# Patient Record
Sex: Female | Born: 1955 | Race: White | Hispanic: No | Marital: Single | Smoking: Former smoker
Health system: Southern US, Community
[De-identification: ages and names within clinical notes are randomized; demographics above are authoritative.]

## PROBLEM LIST (undated history)

## (undated) DIAGNOSIS — K754 Autoimmune hepatitis: Secondary | ICD-10-CM

## (undated) DIAGNOSIS — K529 Noninfective gastroenteritis and colitis, unspecified: Secondary | ICD-10-CM

## (undated) DIAGNOSIS — D126 Benign neoplasm of colon, unspecified: Secondary | ICD-10-CM

## (undated) DIAGNOSIS — K5730 Diverticulosis of large intestine without perforation or abscess without bleeding: Secondary | ICD-10-CM

## (undated) DIAGNOSIS — J439 Emphysema, unspecified: Secondary | ICD-10-CM

## (undated) DIAGNOSIS — K5289 Other specified noninfective gastroenteritis and colitis: Secondary | ICD-10-CM

## (undated) DIAGNOSIS — K648 Other hemorrhoids: Secondary | ICD-10-CM

## (undated) HISTORY — DX: Diverticulosis of large intestine without perforation or abscess without bleeding: K57.30

## (undated) HISTORY — DX: Autoimmune hepatitis: K75.4

## (undated) HISTORY — DX: Other specified noninfective gastroenteritis and colitis: K52.89

## (undated) HISTORY — PX: LIVER BIOPSY: SHX301

## (undated) HISTORY — DX: Noninfective gastroenteritis and colitis, unspecified: K52.9

## (undated) HISTORY — PX: ABDOMINAL HYSTERECTOMY: SUR658

## (undated) HISTORY — DX: Benign neoplasm of colon, unspecified: D12.6

## (undated) HISTORY — DX: Other hemorrhoids: K64.8

## (undated) HISTORY — PX: COLONOSCOPY: SHX174

## (undated) HISTORY — PX: POLYPECTOMY: SHX149

## (undated) HISTORY — DX: Emphysema, unspecified: J43.9

## (undated) HISTORY — PX: KNEE ARTHROSCOPY: SUR90

---

## 2001-04-05 ENCOUNTER — Emergency Department (HOSPITAL_COMMUNITY): Admission: EM | Admit: 2001-04-05 | Discharge: 2001-04-05 | Payer: Self-pay | Admitting: Emergency Medicine

## 2001-08-25 ENCOUNTER — Other Ambulatory Visit: Admission: RE | Admit: 2001-08-25 | Discharge: 2001-08-25 | Payer: Self-pay | Admitting: Obstetrics and Gynecology

## 2002-07-21 ENCOUNTER — Ambulatory Visit (HOSPITAL_COMMUNITY): Admission: RE | Admit: 2002-07-21 | Discharge: 2002-07-21 | Payer: Self-pay | Admitting: Gastroenterology

## 2002-07-21 ENCOUNTER — Encounter (INDEPENDENT_AMBULATORY_CARE_PROVIDER_SITE_OTHER): Payer: Self-pay | Admitting: Specialist

## 2002-07-21 ENCOUNTER — Encounter: Payer: Self-pay | Admitting: Gastroenterology

## 2002-09-18 ENCOUNTER — Encounter: Payer: Self-pay | Admitting: Emergency Medicine

## 2002-09-18 ENCOUNTER — Emergency Department (HOSPITAL_COMMUNITY): Admission: EM | Admit: 2002-09-18 | Discharge: 2002-09-18 | Payer: Self-pay | Admitting: Emergency Medicine

## 2002-09-21 ENCOUNTER — Encounter: Payer: Self-pay | Admitting: Gastroenterology

## 2002-09-21 ENCOUNTER — Ambulatory Visit (HOSPITAL_COMMUNITY): Admission: RE | Admit: 2002-09-21 | Discharge: 2002-09-21 | Payer: Self-pay | Admitting: Gastroenterology

## 2002-11-15 ENCOUNTER — Other Ambulatory Visit: Admission: RE | Admit: 2002-11-15 | Discharge: 2002-11-15 | Payer: Self-pay | Admitting: Obstetrics and Gynecology

## 2002-12-24 ENCOUNTER — Encounter: Payer: Self-pay | Admitting: Internal Medicine

## 2002-12-24 ENCOUNTER — Encounter: Admission: RE | Admit: 2002-12-24 | Discharge: 2002-12-24 | Payer: Self-pay | Admitting: Internal Medicine

## 2003-04-27 ENCOUNTER — Ambulatory Visit (HOSPITAL_COMMUNITY): Admission: RE | Admit: 2003-04-27 | Discharge: 2003-04-27 | Payer: Self-pay | Admitting: Gastroenterology

## 2003-05-08 ENCOUNTER — Ambulatory Visit (HOSPITAL_COMMUNITY): Admission: RE | Admit: 2003-05-08 | Discharge: 2003-05-08 | Payer: Self-pay | Admitting: Gastroenterology

## 2004-06-24 ENCOUNTER — Ambulatory Visit: Payer: Self-pay | Admitting: Internal Medicine

## 2004-07-08 ENCOUNTER — Ambulatory Visit: Payer: Self-pay | Admitting: Gastroenterology

## 2004-07-15 ENCOUNTER — Ambulatory Visit: Payer: Self-pay | Admitting: Gastroenterology

## 2004-11-26 ENCOUNTER — Other Ambulatory Visit: Admission: RE | Admit: 2004-11-26 | Discharge: 2004-11-26 | Payer: Self-pay | Admitting: Obstetrics and Gynecology

## 2004-12-25 ENCOUNTER — Ambulatory Visit: Payer: Self-pay | Admitting: Gastroenterology

## 2004-12-30 ENCOUNTER — Ambulatory Visit: Payer: Self-pay | Admitting: Gastroenterology

## 2005-01-22 ENCOUNTER — Emergency Department (HOSPITAL_COMMUNITY): Admission: EM | Admit: 2005-01-22 | Discharge: 2005-01-22 | Payer: Self-pay | Admitting: Emergency Medicine

## 2005-06-23 ENCOUNTER — Ambulatory Visit: Payer: Self-pay | Admitting: Gastroenterology

## 2005-06-30 ENCOUNTER — Ambulatory Visit: Payer: Self-pay | Admitting: Gastroenterology

## 2005-08-01 ENCOUNTER — Encounter (INDEPENDENT_AMBULATORY_CARE_PROVIDER_SITE_OTHER): Payer: Self-pay | Admitting: Specialist

## 2005-08-01 ENCOUNTER — Ambulatory Visit: Payer: Self-pay | Admitting: Gastroenterology

## 2005-09-01 ENCOUNTER — Ambulatory Visit: Payer: Self-pay | Admitting: Gastroenterology

## 2006-04-20 IMAGING — CR DG CHEST 2V
2 series · 2 of 2 positions shown · non-contrast
Comparison: 04/27/03.

CLINICAL DATA: Shortness of breath and cough.
 CHEST - 2 VIEW: 
 PA and lateral chest ? 01/22/05:

[w chest pa]
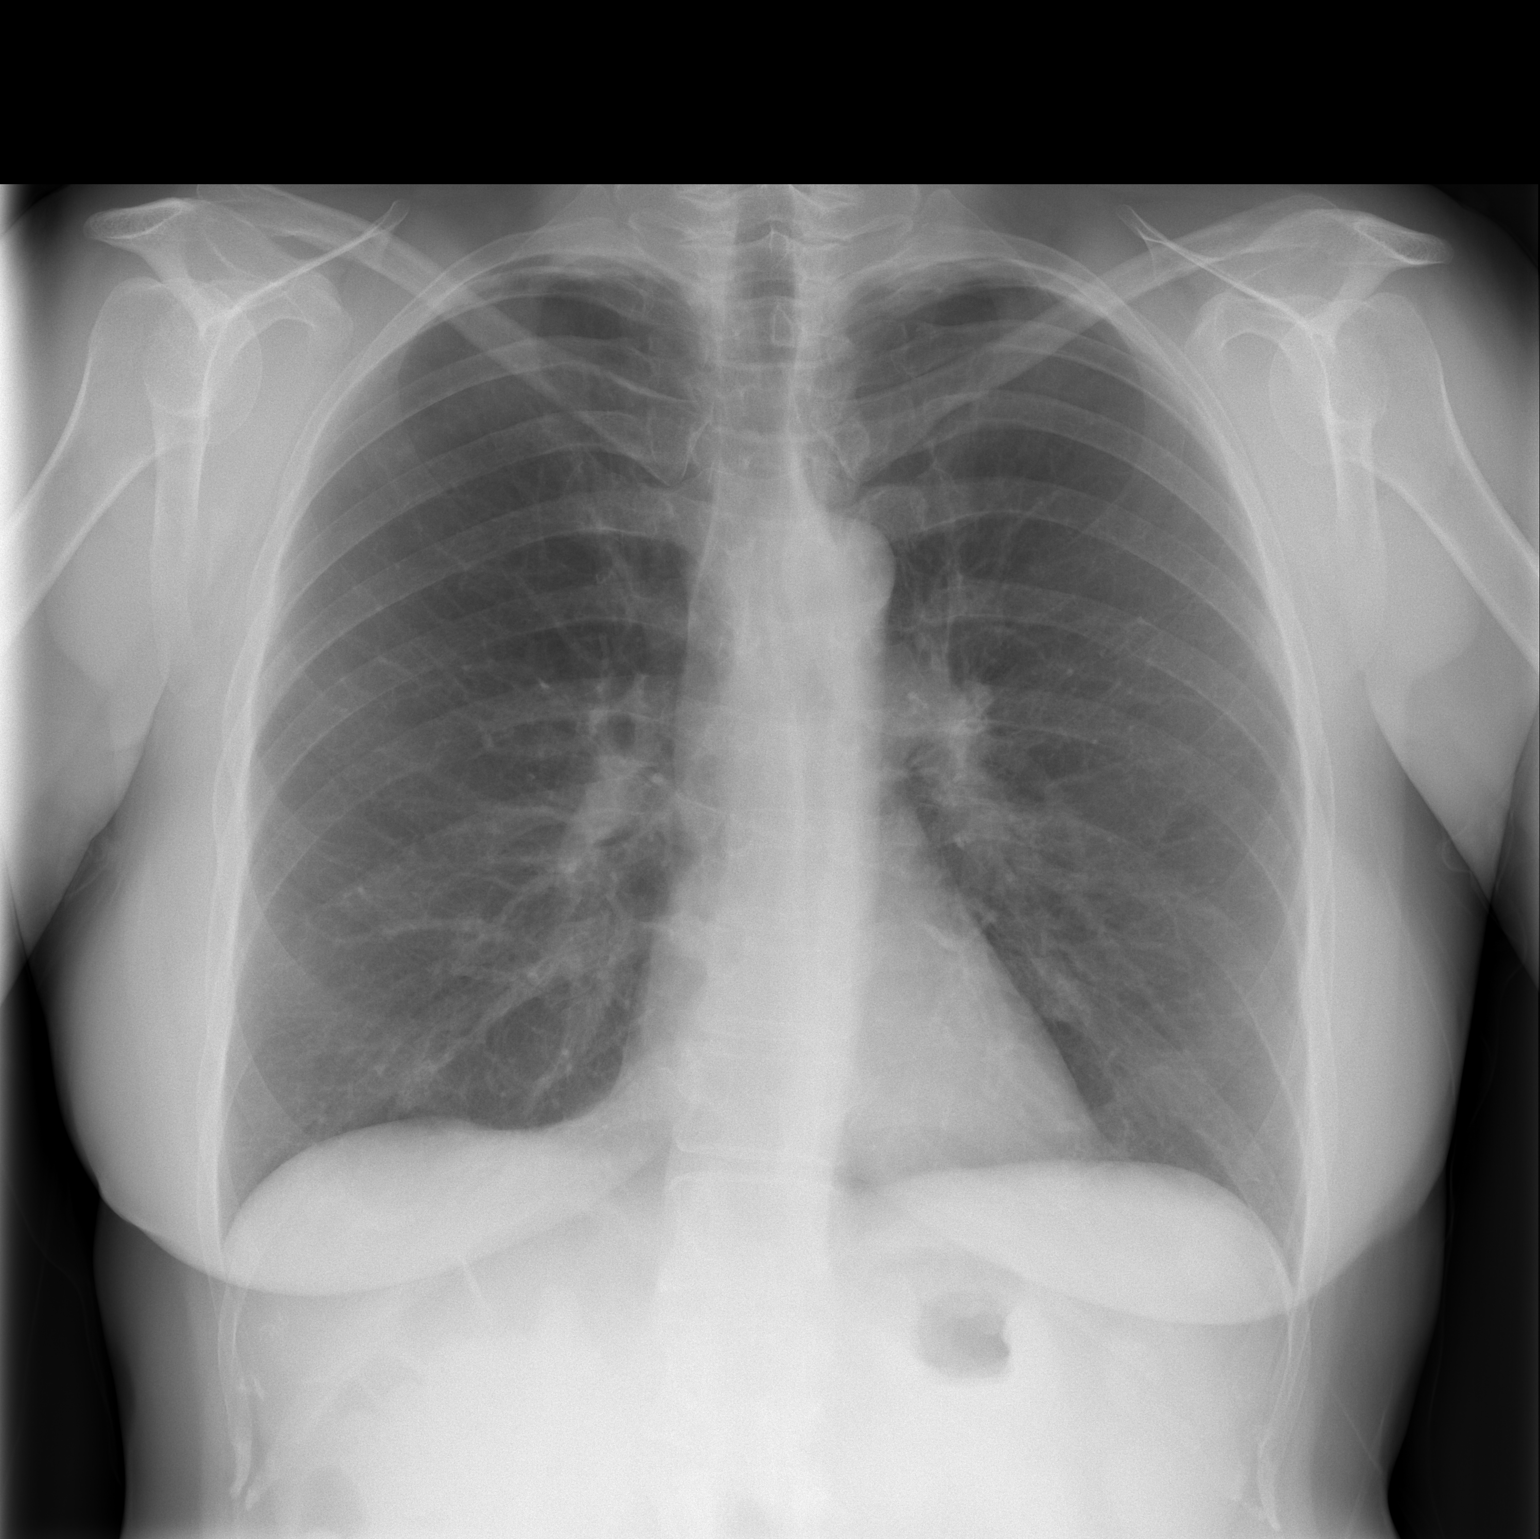

[w chest lat]
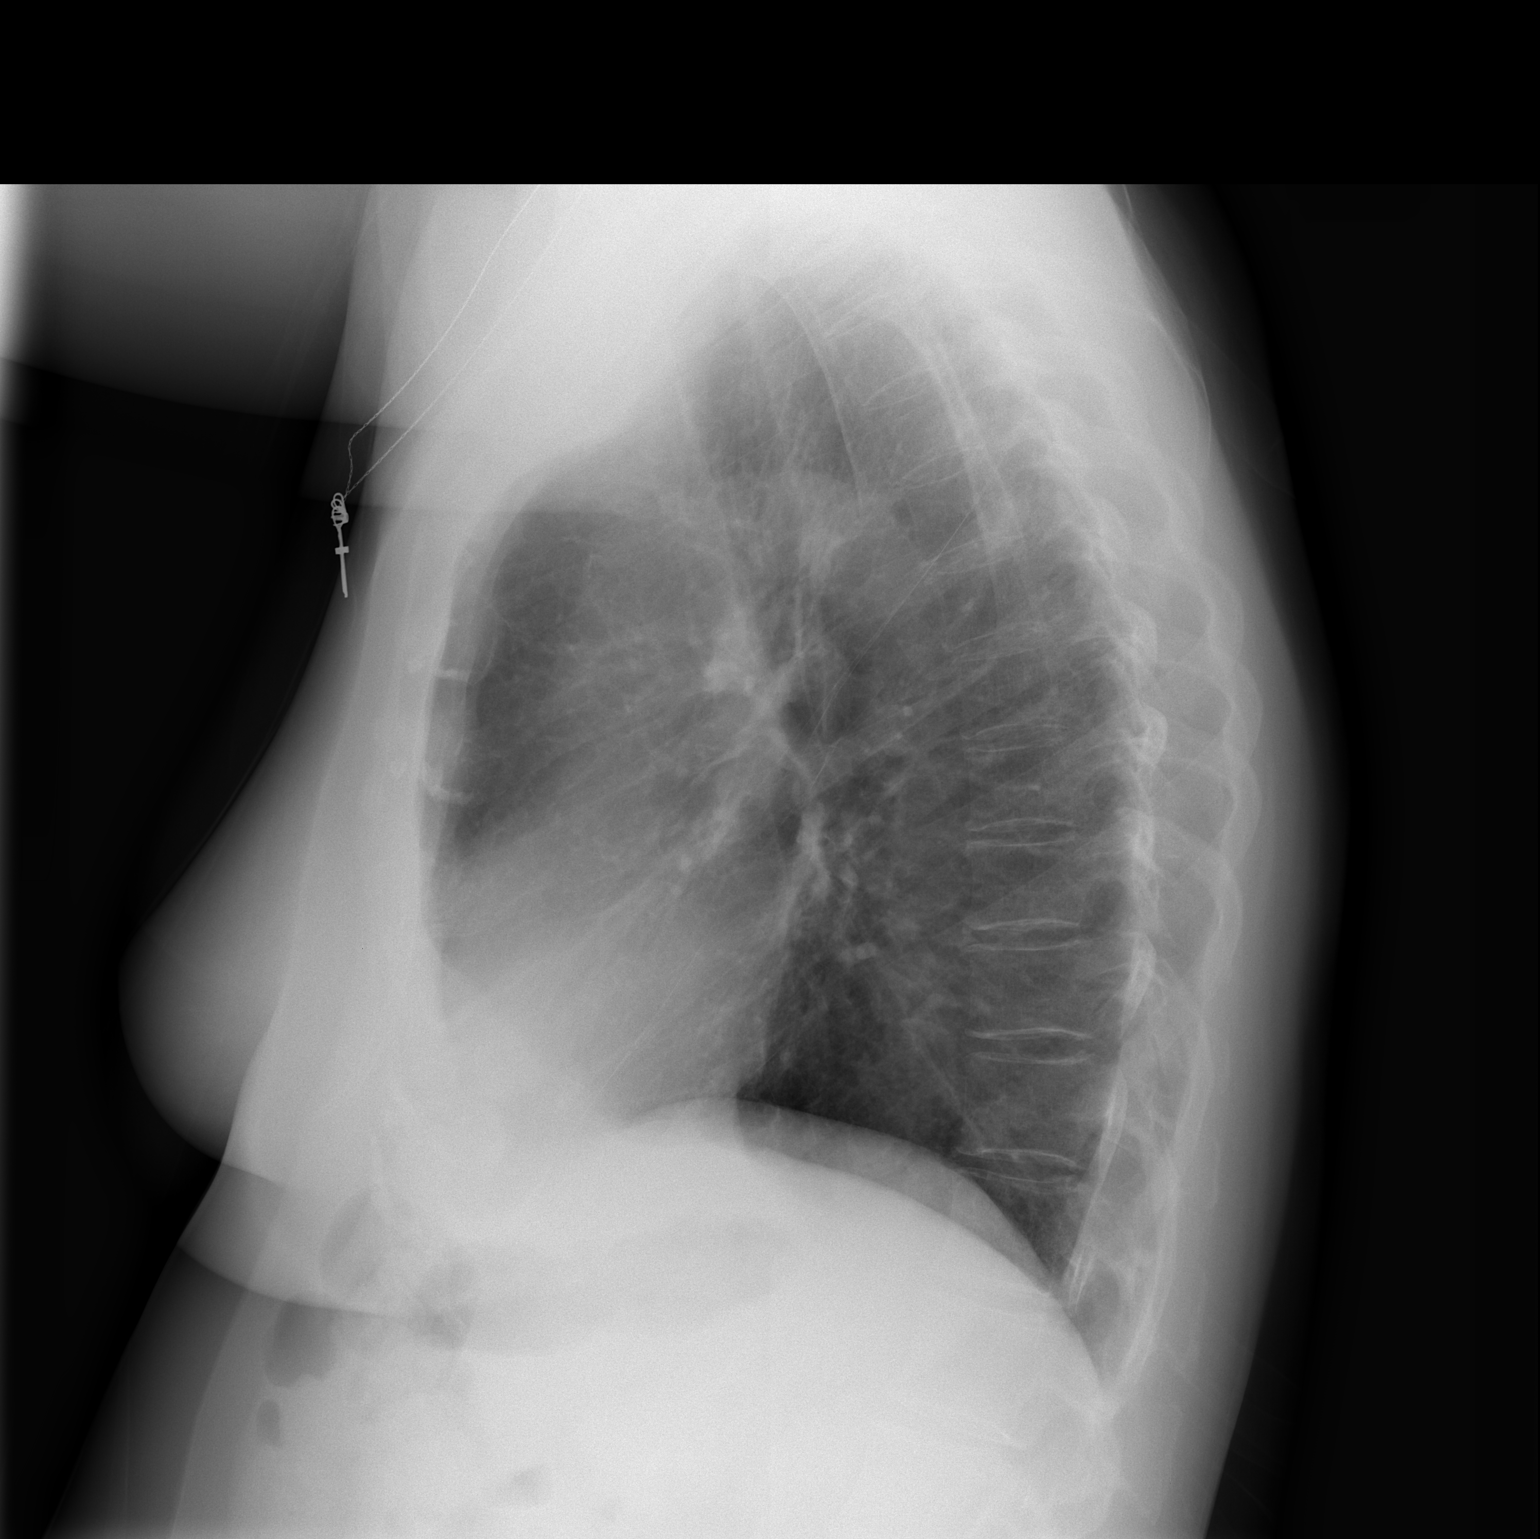

[2 of 2 positions shown; findings below may reference images not displayed]

FINDINGS: There is enlargement of the retrosternal air space and hyperinflation compatible with emphysema.  The lungs are clear.   No pleural effusion.  The heart size is normal.   Biapical pleuroparenchymal scarring is noted.
IMPRESSION: No acute disease.

## 2007-12-10 DIAGNOSIS — K648 Other hemorrhoids: Secondary | ICD-10-CM | POA: Insufficient documentation

## 2007-12-10 DIAGNOSIS — K5730 Diverticulosis of large intestine without perforation or abscess without bleeding: Secondary | ICD-10-CM | POA: Insufficient documentation

## 2007-12-10 DIAGNOSIS — D126 Benign neoplasm of colon, unspecified: Secondary | ICD-10-CM

## 2007-12-10 DIAGNOSIS — K5190 Ulcerative colitis, unspecified, without complications: Secondary | ICD-10-CM

## 2007-12-14 ENCOUNTER — Ambulatory Visit: Payer: Self-pay | Admitting: Gastroenterology

## 2007-12-14 DIAGNOSIS — K754 Autoimmune hepatitis: Secondary | ICD-10-CM | POA: Insufficient documentation

## 2007-12-14 LAB — CONVERTED CEMR LAB
ALT: 14 units/L (ref 0–35)
AST: 19 units/L (ref 0–37)
Albumin: 4.2 g/dL (ref 3.5–5.2)
Alkaline Phosphatase: 86 units/L (ref 39–117)
Basophils Absolute: 0 10*3/uL (ref 0.0–0.1)
Basophils Relative: 0.7 % (ref 0.0–3.0)
Bilirubin, Direct: 0.1 mg/dL (ref 0.0–0.3)
Eosinophils Absolute: 0.2 10*3/uL (ref 0.0–0.7)
Eosinophils Relative: 3.7 % (ref 0.0–5.0)
HCT: 43.7 % (ref 36.0–46.0)
Hemoglobin: 15 g/dL (ref 12.0–15.0)
Lymphocytes Relative: 32.5 % (ref 12.0–46.0)
MCHC: 34.4 g/dL (ref 30.0–36.0)
MCV: 85.7 fL (ref 78.0–100.0)
Monocytes Absolute: 0.4 10*3/uL (ref 0.1–1.0)
Monocytes Relative: 6 % (ref 3.0–12.0)
Neutro Abs: 3.9 10*3/uL (ref 1.4–7.7)
Neutrophils Relative %: 57.1 % (ref 43.0–77.0)
Platelets: 245 10*3/uL (ref 150–400)
RBC: 5.1 M/uL (ref 3.87–5.11)
RDW: 12 % (ref 11.5–14.6)
Total Bilirubin: 0.5 mg/dL (ref 0.3–1.2)
Total Protein: 7.5 g/dL (ref 6.0–8.3)
WBC: 6.7 10*3/uL (ref 4.5–10.5)

## 2007-12-17 ENCOUNTER — Encounter: Payer: Self-pay | Admitting: Gastroenterology

## 2008-01-05 ENCOUNTER — Telehealth: Payer: Self-pay | Admitting: Gastroenterology

## 2008-05-11 ENCOUNTER — Encounter: Payer: Self-pay | Admitting: Gastroenterology

## 2008-06-16 ENCOUNTER — Telehealth: Payer: Self-pay | Admitting: Gastroenterology

## 2008-06-23 ENCOUNTER — Ambulatory Visit: Payer: Self-pay | Admitting: Gastroenterology

## 2008-06-23 LAB — CONVERTED CEMR LAB
ALT: 15 units/L (ref 0–35)
AST: 18 units/L (ref 0–37)
Albumin: 4 g/dL (ref 3.5–5.2)
Alkaline Phosphatase: 100 units/L (ref 39–117)
Basophils Absolute: 0 10*3/uL (ref 0.0–0.1)
Basophils Relative: 0.3 % (ref 0.0–3.0)
Bilirubin, Direct: 0.1 mg/dL (ref 0.0–0.3)
Eosinophils Absolute: 0.2 10*3/uL (ref 0.0–0.7)
Eosinophils Relative: 2.9 % (ref 0.0–5.0)
HCT: 41.1 % (ref 36.0–46.0)
Hemoglobin: 14.2 g/dL (ref 12.0–15.0)
Lymphocytes Relative: 26.5 % (ref 12.0–46.0)
MCHC: 34.6 g/dL (ref 30.0–36.0)
MCV: 84.2 fL (ref 78.0–100.0)
Monocytes Absolute: 0.3 10*3/uL (ref 0.1–1.0)
Monocytes Relative: 5.2 % (ref 3.0–12.0)
Neutro Abs: 4.3 10*3/uL (ref 1.4–7.7)
Neutrophils Relative %: 65.1 % (ref 43.0–77.0)
Platelets: 189 10*3/uL (ref 150–400)
RBC: 4.89 M/uL (ref 3.87–5.11)
RDW: 12.1 % (ref 11.5–14.6)
Total Bilirubin: 0.8 mg/dL (ref 0.3–1.2)
Total Protein: 7.2 g/dL (ref 6.0–8.3)
WBC: 6.5 10*3/uL (ref 4.5–10.5)

## 2008-08-24 ENCOUNTER — Telehealth: Payer: Self-pay | Admitting: Gastroenterology

## 2008-10-10 ENCOUNTER — Encounter (INDEPENDENT_AMBULATORY_CARE_PROVIDER_SITE_OTHER): Payer: Self-pay | Admitting: *Deleted

## 2009-04-20 ENCOUNTER — Emergency Department (HOSPITAL_COMMUNITY): Admission: EM | Admit: 2009-04-20 | Discharge: 2009-04-20 | Payer: Self-pay | Admitting: Emergency Medicine

## 2009-06-21 ENCOUNTER — Telehealth: Payer: Self-pay | Admitting: Gastroenterology

## 2010-05-21 NOTE — Progress Notes (Signed)
Summary: Schedule REV  Phone Note Outgoing Call Call back at [facility] Phone [phone]   Call placed by: Harlow Mares CMA Duncan Dull),  June 21, 2009 10:11 AM Call placed to: Patient Summary of Call: patients number is unavi. at this time. she needs to schedule an office visit.  Initial call taken by: Harlow Mares CMA Duncan Dull),  June 21, 2009 10:11 AM  Follow-up for Phone Call        Patients number is unavi at this time we will mail a letter  Follow-up by: Harlow Mares CMA Duncan Dull),  June 28, 2009 3:34 PM

## 2010-11-19 ENCOUNTER — Ambulatory Visit (INDEPENDENT_AMBULATORY_CARE_PROVIDER_SITE_OTHER): Payer: BC Managed Care – PPO | Admitting: Gastroenterology

## 2010-11-19 ENCOUNTER — Encounter: Payer: Self-pay | Admitting: Gastroenterology

## 2010-11-19 ENCOUNTER — Other Ambulatory Visit (INDEPENDENT_AMBULATORY_CARE_PROVIDER_SITE_OTHER): Payer: BC Managed Care – PPO

## 2010-11-19 DIAGNOSIS — K754 Autoimmune hepatitis: Secondary | ICD-10-CM

## 2010-11-19 DIAGNOSIS — K5289 Other specified noninfective gastroenteritis and colitis: Secondary | ICD-10-CM

## 2010-11-19 DIAGNOSIS — K5190 Ulcerative colitis, unspecified, without complications: Secondary | ICD-10-CM

## 2010-11-19 DIAGNOSIS — D126 Benign neoplasm of colon, unspecified: Secondary | ICD-10-CM

## 2010-11-19 LAB — CBC WITH DIFFERENTIAL/PLATELET
Basophils Absolute: 0 10*3/uL (ref 0.0–0.1)
Basophils Relative: 0.6 % (ref 0.0–3.0)
Eosinophils Absolute: 0.1 10*3/uL (ref 0.0–0.7)
Eosinophils Relative: 1.9 % (ref 0.0–5.0)
HCT: 45.3 % (ref 36.0–46.0)
Hemoglobin: 14.2 g/dL (ref 12.0–15.0)
Lymphocytes Relative: 25.1 % (ref 12.0–46.0)
Lymphs Abs: 1.8 10*3/uL (ref 0.7–4.0)
MCV: 86.6 fl (ref 78.0–100.0)
Monocytes Absolute: 0.5 10*3/uL (ref 0.1–1.0)
Monocytes Relative: 6.6 % (ref 3.0–12.0)
Neutro Abs: 4.7 10*3/uL (ref 1.4–7.7)
Platelets: 233 10*3/uL (ref 150.0–400.0)
RBC: 5.24 Mil/uL — ABNORMAL HIGH (ref 3.87–5.11)
RDW: 13.4 % (ref 11.5–14.6)
WBC: 7.1 10*3/uL (ref 4.5–10.5)

## 2010-11-19 MED ORDER — DIAZEPAM 5 MG PO TABS: 5.0000 mg | ORAL_TABLET | Freq: Four times a day (QID) | ORAL | Status: DC | PRN

## 2010-11-19 NOTE — Assessment & Plan Note (Signed)
LFTs in 2009 and 2010 more normal.  Plan repeat LFT

## 2010-11-19 NOTE — Progress Notes (Signed)
History of Present Illness:  Kristina Moore is a 55 year old white female with history of ulcerative colitis and autoimmune hepatitis,  here to reestablish care.  She has been taking mesalamine medications and has been symptom-free for several years. Colonoscopy 2007 demonstrated an adenomatous polyp which was removed. She has not received therapy for her hepatitis. LFTs have been normal. She has no GI complaints including abdominal pain, change in bowel habits, melena or hematochezia. She's currently on no medications.    Review of Systems: Pertinent positive and negative review of systems were noted in the above HPI section. All other review of systems were otherwise negative.    Current Medications, Allergies, Past Medical History, Past Surgical History, Family History and Social History were reviewed in [employer] electronic medical record  Vital signs were reviewed in today's medical record. Physical Exam: General: Well developed , well nourished, no acute distress Head: Normocephalic and atraumatic Eyes:  sclerae anicteric, EOMI Ears: Normal auditory acuity Mouth: No deformity or lesions Lungs: Clear throughout to auscultation Heart: Regular rate and rhythm; no murmurs, rubs or bruits Abdomen: Soft, non tender and non distended. No masses, hepatosplenomegaly or hernias noted. Normal Bowel sounds Rectal:deferred Musculoskeletal: Symmetrical with no gross deformities  Pulses:  Normal pulses noted Extremities: No clubbing, cyanosis, edema or deformities noted Neurological: Alert oriented x 4, grossly nonfocal Psychological:  Alert and cooperative. Normal mood and affect

## 2010-11-19 NOTE — Assessment & Plan Note (Signed)
Plan followup colonoscopy 

## 2010-11-19 NOTE — Patient Instructions (Signed)
Colonoscopy A colonoscopy is an exam to evaluate your entire colon. In this exam, your colon is cleansed. A long fiberoptic tube is inserted through your rectum and into your colon. The fiberoptic scope (endoscope) is a long bundle of enclosed and very flexible fibers. These fibers transmit light to the area examined and send images from that area to your caregiver. Discomfort is usually minimal. You may be given a drug to help you sleep (sedative) during or prior to the procedure. This exam helps to detect lumps (tumors), polyps, inflammation, and areas of bleeding. Your caregiver may also take a small piece of tissue (biopsy) that will be examined under a microscope. BEFORE THE PROCEDURE  A clear liquid diet may be required for 2 days before the exam.   Liquid injections (enemas) or laxatives may be required.   A large amount of electrolyte solution may be given to you to drink over a short period of time. This solution is used to clean out your colon.   You should be present 1 prior to your procedure or as directed by your caregiver.   Check in at the admissions desk to fill out necessary forms if not preregistered. There will be consent forms to sign prior to the procedure. If accompanied by friends or family, there is a waiting area for them while you are having your procedure.  LET YOUR CAREGIVER KNOW ABOUT:  Allergies to food or medicine.  Medicines taken, including vitamins, herbs, eyedrops, over-the-counter medicines, and creams.   Use of steroids (by mouth or creams).   Previous problems with anesthetics or numbing medicines.   History of bleeding problems or blood clots.  Previous surgery.   Other health problems, including diabetes and kidney problems.   Possibility of pregnancy, if this applies.   AFTER THE PROCEDURE  If you received a sedative and/or pain medicine, you will need to arrange for someone to drive you home.   Occasionally, there is a little blood passed  with the first bowel movement. DO NOT be concerned.  HOME CARE INSTRUCTIONS  It is not unusual to pass moderate amounts of gas and experience mild abdominal cramping following the procedure. This is due to air being used to inflate your colon during the exam. Walking or a warm pack on your belly (abdomen) may help.   You may resume all normal meals and activities after sedatives and medicines have worn off.   Only take over-the-counter or prescription medicines for pain, discomfort, or fever as directed by your caregiver. DO NOT use aspirin or blood thinners if a biopsy was taken. Consult your caregiver for medicine usage if biopsies were taken.  FINDING OUT THE RESULTS OF YOUR TEST Not all test results are available during your visit. If your test results are not back during the visit, make an appointment with your caregiver to find out the results. Do not assume everything is normal if you have not heard from your caregiver or the medical facility. It is important for you to follow up on all of your test results. SEEK IMMEDIATE MEDICAL CARE IF:    You pass large blood clots or fill a toilet with blood following the procedure. This may also occur 10 to 14 days following the procedure. This is more likely if a biopsy was taken.   You develop abdominal pain that keeps getting worse and cannot be relieved with medicine.  Document Released: 04/04/2000 Document Re-Released: 07/02/2009 [facility] Patient Information 2011 [place], [place]. You will go to the  basement today for labs Your colonoscopy is scheduled on 12/17/2010 at 3:30pm Call us when you are ready for [place] to send in the prescription for your MoviPrep

## 2010-11-19 NOTE — Assessment & Plan Note (Addendum)
Patient is asymmetrical off medications.  Recommendations #1 hold medications until reviewing  colonoscopy finding

## 2010-11-20 ENCOUNTER — Encounter: Payer: Self-pay | Admitting: Gastroenterology

## 2010-11-20 LAB — BASIC METABOLIC PANEL
BUN: 10 mg/dL (ref 6–23)
CO2: 29 mEq/L (ref 19–32)
Calcium: 9.3 mg/dL (ref 8.4–10.5)
Chloride: 108 mEq/L (ref 96–112)
Creatinine, Ser: 0.9 mg/dL (ref 0.4–1.2)
GFR: 69.39 mL/min (ref >60.00)
Glucose, Bld: 118 mg/dL — ABNORMAL HIGH (ref 70–99)
Potassium: 4 mEq/L (ref 3.5–5.1)

## 2010-11-25 ENCOUNTER — Encounter: Payer: Self-pay | Admitting: Gastroenterology

## 2010-12-09 ENCOUNTER — Telehealth: Payer: Self-pay | Admitting: Gastroenterology

## 2010-12-09 MED ORDER — PEG-KCL-NACL-NASULF-NA ASC-C 100 G PO SOLR: 1.0000 | Freq: Once | ORAL | Status: DC

## 2010-12-09 NOTE — Telephone Encounter (Signed)
Med Sent 

## 2010-12-17 ENCOUNTER — Encounter: Payer: Self-pay | Admitting: Gastroenterology

## 2010-12-17 ENCOUNTER — Ambulatory Visit (AMBULATORY_SURGERY_CENTER): Payer: BC Managed Care – PPO | Admitting: Gastroenterology

## 2010-12-17 DIAGNOSIS — Z86010 Personal history of colonic polyps: Secondary | ICD-10-CM

## 2010-12-17 DIAGNOSIS — K5730 Diverticulosis of large intestine without perforation or abscess without bleeding: Secondary | ICD-10-CM

## 2010-12-17 DIAGNOSIS — D126 Benign neoplasm of colon, unspecified: Secondary | ICD-10-CM

## 2010-12-17 DIAGNOSIS — K5190 Ulcerative colitis, unspecified, without complications: Secondary | ICD-10-CM

## 2010-12-17 MED ORDER — SODIUM CHLORIDE 0.9 % IV SOLN: 500.0000 mL | INTRAVENOUS | Status: DC

## 2010-12-17 NOTE — Patient Instructions (Signed)
Please refer to the blue and neon green sheets for instructions regarding diet and activity for the rest of today.  You may resume your medications as you would normally take them.  Hemorrhoids Hemorrhoids are veins that get big in the rectum. If these veins get blocked, it leads to more problems. When the veins are blocked, it makes them puffy (swollen) and painful. HOME CARE  Eat food that is good for you.   Drink 6 to 8 glasses of water every day or as told by your doctor.   Avoid straining to poop (bowel movement).   Keep the anal area dry and clean.   Only take medicine as told by your doctor.   Your doctor may tell you to take a medicine (laxative) to make you poop. Follow your doctor's instructions.  If your hemorrhoids are puffy and painful:  Take a warm bath for 20 to 30 minutes. Do this 3 to 4 times a day.   If your hemorrhoids are very sore, place ice packs on the area. Use the ice packs between the baths.   Put ice in a plastic bag.   Place a towel between your skin and the bag.   Do not use a donut-shaped pillow. Do not sit on the toilet for a long time.   Go to the bathroom when your body has the urge to poop (bowel movement). This is so you do not strain as much to poop.  GET HELP RIGHT AWAY IF:  You have increasing pain that is not controlled with medicine.   You have uncontrolled bleeding.   You cannot poop.   You have pain outside the area of the hemorrhoids.   You have chills.   You have a temperature by mouth, not controlled by medicine.  MAKE SURE YOU:  Understand these instructions.   Will watch your condition.   Will get help right away if you are not doing well or get worse.  Document Released: 01/15/2008 Document Re-Released: 07/02/2009 [facility] Patient Information 2011 [place], [place]. Diverticulosis Diverticulosis is a common condition that develops when small pouches (diverticula) form in the wall of the colon. The risk of  diverticulosis increases with age. It happens more often in people who eat a low-fiber diet. Most individuals with diverticulosis have no symptoms. Those individuals with symptoms usually experience belly (abdominal) pain, constipation, or loose stools (diarrhea). HOME CARE INSTRUCTIONS  Increase the amount of fiber in your diet as directed by your caregiver or dietician. This may reduce symptoms of diverticulosis.   Your caregiver may recommend taking a dietary fiber supplement.   Drink at least 6 to 8 glasses of water each day to prevent constipation.   Try not to strain when you have a bowel movement.   Your caregiver may recommend avoiding nuts and seeds to prevent complications, although this is still an uncertain benefit.   Only take over-the-counter or prescription medicines for pain, discomfort, or fever as directed by your caregiver.  FOODS HAVING HIGH FIBER CONTENT INCLUDE:  Fruits. Apple, peach, pear, tangerine, raisins, prunes.   Vegetables. Brussels sprouts, asparagus, broccoli, cabbage, carrot, cauliflower, romaine lettuce, spinach, summer squash, tomato, winter squash, zucchini.   Starchy Vegetables. Baked beans, kidney beans, lima beans, split peas, lentils, potatoes (with skin).   Grains. Whole wheat bread, brown rice, bran flake cereal, plain oatmeal, white rice, shredded wheat, bran muffins.  SEEK IMMEDIATE MEDICAL CARE IF:  You develop increasing pain or severe bloating.   You have a high oral  temperature, not controlled by medicine.   You develop vomiting or bowel movements that are bloody or black.  Document Released: 01/03/2004 Document Re-Released: 09/25/2009 [facility] Patient Information 2011 [place], [place].

## 2010-12-18 ENCOUNTER — Telehealth: Payer: Self-pay | Admitting: *Deleted

## 2010-12-18 NOTE — Telephone Encounter (Signed)
Follow up Call- Patient questions:  Do you have a fever, pain , or abdominal swelling? no Pain Score  0 *  Have you tolerated food without any problems? yes  Have you been able to return to your normal activities? yes  Do you have any questions about your discharge instructions: Diet   no Medications  no Follow up visit  yes  Do you have questions or concerns about your Care? no  Actions: * If pain score is 4 or above: No action needed, pain <4.  Pt had a question about when she needed to return to see Dr. Arlyce Dice. Informed pt that she should have a office visit with Dr Arlyce Dice in 1 year, per results of her colonoscopy exam.

## 2011-03-06 ENCOUNTER — Other Ambulatory Visit: Payer: Self-pay | Admitting: *Deleted

## 2011-03-06 NOTE — Telephone Encounter (Signed)
Prescription ok'd by Dr Arlyce Dice. Faxed over authorization to pts pharmacy today

## 2011-03-06 NOTE — Telephone Encounter (Signed)
yes

## 2011-03-06 NOTE — Telephone Encounter (Signed)
Dr Arlyce Dice, This pt is requesting a refill of Valium is it ok to refill

## 2011-05-14 ENCOUNTER — Telehealth: Payer: Self-pay | Admitting: Gastroenterology

## 2011-05-14 ENCOUNTER — Encounter: Payer: Self-pay | Admitting: Physician Assistant

## 2011-05-14 ENCOUNTER — Ambulatory Visit (INDEPENDENT_AMBULATORY_CARE_PROVIDER_SITE_OTHER): Payer: BC Managed Care – PPO | Admitting: Physician Assistant

## 2011-05-14 ENCOUNTER — Other Ambulatory Visit (INDEPENDENT_AMBULATORY_CARE_PROVIDER_SITE_OTHER): Payer: BC Managed Care – PPO

## 2011-05-14 DIAGNOSIS — K5190 Ulcerative colitis, unspecified, without complications: Secondary | ICD-10-CM

## 2011-05-14 DIAGNOSIS — R197 Diarrhea, unspecified: Secondary | ICD-10-CM

## 2011-05-14 LAB — CBC WITH DIFFERENTIAL/PLATELET
Basophils Absolute: 0.1 10*3/uL (ref 0.0–0.1)
Eosinophils Absolute: 0.2 10*3/uL (ref 0.0–0.7)
HCT: 44.8 % (ref 36.0–46.0)
Hemoglobin: 15.3 g/dL — ABNORMAL HIGH (ref 12.0–15.0)
Lymphs Abs: 1.7 10*3/uL (ref 0.7–4.0)
MCHC: 34.2 g/dL (ref 30.0–36.0)
MCV: 84.4 fl (ref 78.0–100.0)
Monocytes Absolute: 0.4 10*3/uL (ref 0.1–1.0)
Neutro Abs: 7 10*3/uL (ref 1.4–7.7)
Platelets: 250 10*3/uL (ref 150.0–400.0)
RDW: 13 % (ref 11.5–14.6)

## 2011-05-14 LAB — HEPATIC FUNCTION PANEL
Albumin: 4.2 g/dL (ref 3.5–5.2)
Alkaline Phosphatase: 103 U/L (ref 39–117)
Total Protein: 7.4 g/dL (ref 6.0–8.3)

## 2011-05-14 MED ORDER — DIAZEPAM 5 MG PO TABS: ORAL_TABLET | ORAL | Status: DC

## 2011-05-14 MED ORDER — MESALAMINE 400 MG PO TBEC: DELAYED_RELEASE_TABLET | ORAL | Status: DC

## 2011-05-14 MED ORDER — DICYCLOMINE HCL 10 MG PO CAPS: 10.0000 mg | ORAL_CAPSULE | Freq: Three times a day (TID) | ORAL | Status: DC

## 2011-05-14 NOTE — Progress Notes (Signed)
Reviewed and agree DB 

## 2011-05-14 NOTE — Progress Notes (Signed)
Subjective:    Patient ID: Kristina Moore, female    DOB: 09-15-1955, 56 y.o.   MRN: [id number]  HPI Kristina Moore is a very nice 56 year old female known to Kristina Moore , with history of ulcerative colitis and autoimmune hepatitis. She was last seen in August of 2012 and at that time with symptom free. She has not had any treatment specifically for her hepatitis and her LFTs have been normal for the past several years. She underwent followup colonoscopy in August of 2012 which was negative with no evidence for active colitis. She was noted to have scattered diverticulosis and internal hemorrhoids. Biopsies did not show any evidence of active colitis either. That time she stopped taking Asacol. She says she had been doing well until about 1 month ago when she started having problems with abdominal cramping and diarrhea. Her symptoms have persisted and now she's having ongoing abdominal cramping after eating and before and after bowel movements which she says is quite painful at times. She also describes abdominal pressure in her lower abdomen. Most days she's having 4 or 5 loose bowel movements, without blood. She has not had any documented fever or sweats. Her appetite has been decreased but she's not lost any significant weight. She also complains of generalized fatigue. She says about a month ago she also been moving some furniture and felt a little into her groin and after that thought she noticed a protrusion from her vagina. Over the past couple of the weeks this is better but she says sometimes if she stands for a long time she still feels a pulling and pressure in that area. She has not been seen by her gynecologist for a while. She denies any urinary symptoms.  She has not been on any new medications or supplements and has not had any antibiotics in the past several months.`     Review of Systems  Constitutional: Positive for fatigue.  HENT: Negative.   Eyes: Negative.   Respiratory: Negative.     Cardiovascular: Negative.   Gastrointestinal: Positive for nausea, abdominal pain and diarrhea.  Genitourinary: Negative.   Musculoskeletal: Negative.   Neurological: Negative.   Hematological: Negative.   Psychiatric/Behavioral: Negative.    Outpatient Prescriptions Prior to Visit  Medication Sig Dispense Refill  . calcium-vitamin D (OSCAL WITH D) 500-200 MG-UNIT per tablet Take 1 tablet by mouth daily.        . diazepam (VALIUM) 5 MG tablet Take 1 tablet (5 mg total) by mouth every 6 (six) hours as needed.  30 tablet  3  . Multiple Vitamin (MULTIVITAMIN) tablet Take 1 tablet by mouth daily.        . mesalamine (ASACOL) 400 MG EC tablet Take 400 mg by mouth. Take 6 tablets daily       . mesalamine (LIALDA) 1.2 G EC tablet Take 1,200 mg by mouth 2 (two) times daily.            No Known Allergies  Objective:   Physical Exam well-developed white female in no acute distress, pleasant. HEENT; nontraumatic, normocephalic EOMI, ,sclera anicteric,,Neck; Supple no JVD, Cardiovascular; regular rate and rhythm with S1-S2 no murmur gallop, Pulmonary; clear bilaterally, Abdomen; soft she is tender in the left mid quadrant ,left lower quadrant and suprapubic area no guarding, no rebound, no palpable mass or hepatosplenomegaly bowel sounds are active, Rectal; not done, Extremities; no ,cyanosis or edema, Psych ;mood and affect normal and appropriate.        Impression/plan;  #1 #1   #  50 56 year old female with history of ulcerative colitis which has been in remission now with 4 week history of lower abdominal cramping and diarrhea with 4-5 bowel movements per day and generalized fatigue. Will rule out infectious etiology, though suspect she is having an exacerbation of her ulcerative colitis. #2 history of autoimmune hepatitis, will check followup hepatic panel #3 history of adenomatous colon polyps last colonoscopy August 2012 no polyps, due for followup at five-year interval Plan; check CBC,  hepatic panel, and CRP today Restart Asacol 400 mg 3 tablets by mouth 3 times daily, new prescription and refills were given Bentyl 10 mg 3 times daily as needed for cramping and pain Check stool cultures and C. difficile PCR to  rule out superimposed infectious etiology . Patient will followup with Kristina Moore in 3-4 weeks, she knows to call in the interim problems

## 2011-05-14 NOTE — Telephone Encounter (Signed)
Pt states that she has been having abdominal pain for about 4 weeks. She states she has also been having diarrhea off and on for about 4 weeks. Pt scheduled to see Amy Esterwood PA today at 3pm. Pt aware of appt date and time.

## 2011-05-14 NOTE — Patient Instructions (Addendum)
You have been given a separate informational sheet regarding your tobacco use, the importance of quitting and local resources to help you quit.  Please go to the basement level to have your labs drawn.  We sent prescriptions for Bentyl, and Asacol. We have given you the prescription for Valium to take to the pharmacy.

## 2011-05-15 ENCOUNTER — Telehealth: Payer: Self-pay

## 2011-05-15 LAB — C-REACTIVE PROTEIN: CRP: 0.33 mg/dL (ref <0.60)

## 2011-05-15 NOTE — Telephone Encounter (Signed)
Pt is aware of results. 

## 2011-05-15 NOTE — Telephone Encounter (Signed)
Message copied by Michele Mcalpine on Thu May 15, 2011 11:11 AM ------      Message from: Maddock, Virginia S      Created: Thu May 15, 2011 10:59 AM       Please let Timiya know that her las look good, no anemia and her liver tests are normal

## 2011-07-22 ENCOUNTER — Telehealth: Payer: Self-pay | Admitting: *Deleted

## 2011-07-22 NOTE — Telephone Encounter (Signed)
Patient wants refill on her Diazepam 5 mg is it ok to refill?  Per Dr Arlyce Dice "YES" Ok to refill on faxed refill request sheet  Faxed over request for Diazepam back to [facility] pharmacy

## 2011-09-30 ENCOUNTER — Other Ambulatory Visit: Payer: Self-pay | Admitting: *Deleted

## 2011-09-30 NOTE — Telephone Encounter (Signed)
refaxed form back to pharmacy

## 2011-09-30 NOTE — Telephone Encounter (Signed)
yes

## 2011-09-30 NOTE — Telephone Encounter (Signed)
Dr Arlyce Dice, This patient is requesting a refill of Diazepam... Can I refill

## 2011-12-15 ENCOUNTER — Encounter: Payer: Self-pay | Admitting: Gastroenterology

## 2011-12-15 ENCOUNTER — Ambulatory Visit (INDEPENDENT_AMBULATORY_CARE_PROVIDER_SITE_OTHER): Payer: BC Managed Care – PPO | Admitting: Gastroenterology

## 2011-12-15 VITALS — BP 176/110 | HR 100 | Ht 68.0 in | Wt 188.2 lb

## 2011-12-15 DIAGNOSIS — D126 Benign neoplasm of colon, unspecified: Secondary | ICD-10-CM

## 2011-12-15 DIAGNOSIS — K5289 Other specified noninfective gastroenteritis and colitis: Secondary | ICD-10-CM

## 2011-12-15 DIAGNOSIS — K754 Autoimmune hepatitis: Secondary | ICD-10-CM

## 2011-12-15 NOTE — Patient Instructions (Addendum)
You have been given a separate informational sheet regarding your tobacco use, the importance of quitting and local resources to help you quit. Please follow up with Dr Arlyce Dice in 1 year. Please go to the basement floor of our office 2 days prior to your scheduled 1 year follow up for the following labs: CBC, CMET

## 2011-12-15 NOTE — Progress Notes (Signed)
History of Present Illness:  The patient has returned for followup of ulcerative colitis. She had a flare approximately 7 months ago for which asacol was restarted. She is back in remission. She is attempting to stop smoking. Last liver tests in January, 2013 were normal.    Review of Systems: Pertinent positive and negative review of systems were noted in the above HPI section. All other review of systems were otherwise negative.    Current Medications, Allergies, Past Medical History, Past Surgical History, Family History and Social History were reviewed in [employer] electronic medical record  Vital signs were reviewed in today's medical record. Physical Exam: General: Well developed , well nourished, no acute distress  Alert and cooperative. Normal mood and affect

## 2011-12-15 NOTE — Assessment & Plan Note (Signed)
A flare in January, 2013 which responded to asacol. Plan to continue with the same.

## 2011-12-15 NOTE — Assessment & Plan Note (Signed)
Plan follow-up colonoscopy 2017 

## 2011-12-15 NOTE — Assessment & Plan Note (Signed)
Liver function tests remain normal off medication

## 2012-02-10 ENCOUNTER — Other Ambulatory Visit: Payer: Self-pay | Admitting: Gastroenterology

## 2012-02-10 NOTE — Telephone Encounter (Signed)
Pt wants refill of valium. Is this ok

## 2012-07-30 ENCOUNTER — Other Ambulatory Visit: Payer: Self-pay | Admitting: Gastroenterology

## 2012-08-03 ENCOUNTER — Telehealth: Payer: Self-pay | Admitting: Gastroenterology

## 2012-08-03 NOTE — Telephone Encounter (Signed)
yes

## 2012-08-03 NOTE — Telephone Encounter (Signed)
Can pt have a refill on valium

## 2012-08-03 NOTE — Telephone Encounter (Signed)
Medication faxed to pharmacy 

## 2012-10-12 ENCOUNTER — Encounter: Payer: Self-pay | Admitting: Gastroenterology

## 2012-11-28 ENCOUNTER — Other Ambulatory Visit: Payer: Self-pay | Admitting: Gastroenterology

## 2013-03-01 ENCOUNTER — Other Ambulatory Visit (INDEPENDENT_AMBULATORY_CARE_PROVIDER_SITE_OTHER): Payer: BC Managed Care – PPO

## 2013-03-01 DIAGNOSIS — K754 Autoimmune hepatitis: Secondary | ICD-10-CM

## 2013-03-01 LAB — CBC WITH DIFFERENTIAL/PLATELET
Basophils Relative: 0.8 % (ref 0.0–3.0)
Eosinophils Absolute: 0.3 10*3/uL (ref 0.0–0.7)
MCHC: 33.4 g/dL (ref 30.0–36.0)
MCV: 82.2 fl (ref 78.0–100.0)
Monocytes Absolute: 0.4 10*3/uL (ref 0.1–1.0)
Neutrophils Relative %: 63.4 % (ref 43.0–77.0)
RBC: 5.17 Mil/uL — ABNORMAL HIGH (ref 3.87–5.11)

## 2013-03-01 LAB — COMPREHENSIVE METABOLIC PANEL
AST: 18 U/L (ref 0–37)
Albumin: 4 g/dL (ref 3.5–5.2)
Alkaline Phosphatase: 91 U/L (ref 39–117)
BUN: 20 mg/dL (ref 6–23)
Creatinine, Ser: 0.8 mg/dL (ref 0.4–1.2)
Potassium: 4.4 mEq/L (ref 3.5–5.1)
Total Bilirubin: 0.7 mg/dL (ref 0.3–1.2)

## 2013-03-02 ENCOUNTER — Ambulatory Visit (INDEPENDENT_AMBULATORY_CARE_PROVIDER_SITE_OTHER): Payer: BC Managed Care – PPO | Admitting: Gastroenterology

## 2013-03-02 ENCOUNTER — Other Ambulatory Visit: Payer: BC Managed Care – PPO

## 2013-03-02 ENCOUNTER — Encounter: Payer: Self-pay | Admitting: Gastroenterology

## 2013-03-02 VITALS — BP 162/106 | HR 76 | Ht 67.0 in | Wt 202.4 lb

## 2013-03-02 DIAGNOSIS — K754 Autoimmune hepatitis: Secondary | ICD-10-CM

## 2013-03-02 DIAGNOSIS — K529 Noninfective gastroenteritis and colitis, unspecified: Secondary | ICD-10-CM

## 2013-03-02 DIAGNOSIS — Z23 Encounter for immunization: Secondary | ICD-10-CM

## 2013-03-02 DIAGNOSIS — K5289 Other specified noninfective gastroenteritis and colitis: Secondary | ICD-10-CM

## 2013-03-02 LAB — HEPATITIS B SURFACE ANTIBODY,QUALITATIVE: Hep B S Ab: NEGATIVE

## 2013-03-02 LAB — HEPATITIS B SURFACE ANTIGEN: Hepatitis B Surface Ag: NEGATIVE

## 2013-03-02 MED ORDER — DIAZEPAM 10 MG PO TABS: 10.0000 mg | ORAL_TABLET | Freq: Four times a day (QID) | ORAL | Status: DC | PRN

## 2013-03-02 NOTE — Assessment & Plan Note (Addendum)
Patient remains in clinical remission on Asacol.  Plan to continue with the same.  If patient is negative for hepatitis A and B. antibodies, she will be vaccinated.  Pneumovax will be administered. Followup colonoscopy 2015

## 2013-03-02 NOTE — Progress Notes (Signed)
History of Present Illness:  Pleasant 57 year old white female with history of ulcerative colitis for greater than 10 years, autoimmune hepatitis, here for annual checkup.  She is maintained on Asacol and has no GI complaints.  She  stopped smoking a year ago.    Review of Systems: She complains of intermittent joint pains Pertinent positive and negative review of systems were noted in the above HPI section. All other review of systems were otherwise negative.    Current Medications, Allergies, Past Medical History, Past Surgical History, Family History and Social History were reviewed in [employer] electronic medical record  Vital signs were reviewed in today's medical record. Physical Exam: General: Well developed , well nourished, no acute distress Skin: anicteric Head: Normocephalic and atraumatic Eyes:  sclerae anicteric, EOMI Ears: Normal auditory acuity Mouth: No deformity or lesions Lungs: Clear throughout to auscultation Heart: Regular rate and rhythm; no murmurs, rubs or bruits Abdomen: Soft, non tender and non distended. No masses, hepatosplenomegaly or hernias noted. Normal Bowel sounds Rectal:deferred Musculoskeletal: Symmetrical with no gross deformities  Pulses:  Normal pulses noted Extremities: No clubbing, cyanosis, edema or deformities noted Neurological: Alert oriented x 4, grossly nonfocal Psychological:  Alert and cooperative. Normal mood and affect

## 2013-03-02 NOTE — Patient Instructions (Signed)
You will go to the basement for labs today  We are giving you a Pneumovax vaccine today

## 2013-03-02 NOTE — Assessment & Plan Note (Signed)
She remains in clinical remission as evidenced by normal transaminases

## 2013-07-11 ENCOUNTER — Other Ambulatory Visit: Payer: Self-pay | Admitting: Gastroenterology

## 2013-07-14 ENCOUNTER — Telehealth: Payer: Self-pay | Admitting: Gastroenterology

## 2013-07-14 NOTE — Telephone Encounter (Signed)
Called pt to inform Dr Deatra Ina is out of the office till Monday  Needs refill on Valium 10mg  Dr Deatra Ina, Is it ok to send  She is ok till Monday to wait back from a response from Dr Deatra Ina

## 2013-07-17 NOTE — Telephone Encounter (Signed)
Ok to refill 

## 2013-07-18 MED ORDER — DIAZEPAM 10 MG PO TABS: 10.0000 mg | ORAL_TABLET | Freq: Four times a day (QID) | ORAL | Status: DC | PRN

## 2013-07-18 NOTE — Telephone Encounter (Signed)
Medication printed and faxed.  

## 2013-12-15 ENCOUNTER — Other Ambulatory Visit: Payer: Self-pay | Admitting: Gastroenterology

## 2013-12-15 NOTE — Telephone Encounter (Signed)
Dr Carlean Purl, She has not been seen since 2014 can she have a valium refill??

## 2013-12-15 NOTE — Telephone Encounter (Signed)
Chart reviewed May have 1 refill

## 2013-12-16 ENCOUNTER — Other Ambulatory Visit: Payer: Self-pay | Admitting: Gastroenterology

## 2013-12-19 NOTE — Telephone Encounter (Signed)
Already faxed on friday

## 2013-12-21 ENCOUNTER — Telehealth: Payer: Self-pay | Admitting: Gastroenterology

## 2013-12-21 ENCOUNTER — Other Ambulatory Visit: Payer: Self-pay | Admitting: Gastroenterology

## 2013-12-21 NOTE — Telephone Encounter (Signed)
Called in script pharmacy stated they never got her refill fax from last week

## 2014-01-31 ENCOUNTER — Other Ambulatory Visit: Payer: Self-pay

## 2014-01-31 ENCOUNTER — Other Ambulatory Visit: Payer: Self-pay | Admitting: Gastroenterology

## 2014-01-31 ENCOUNTER — Telehealth: Payer: Self-pay | Admitting: Gastroenterology

## 2014-01-31 DIAGNOSIS — K754 Autoimmune hepatitis: Secondary | ICD-10-CM

## 2014-01-31 DIAGNOSIS — K5010 Crohn's disease of large intestine without complications: Secondary | ICD-10-CM

## 2014-01-31 NOTE — Telephone Encounter (Signed)
Advised to come in for labs at her convenience. Orders have been put in.

## 2014-01-31 NOTE — Telephone Encounter (Signed)
What labs does she need for her annual check? She has ulcerative colitis (in remission) and auto immune hepatitis. She is on Asacol.

## 2014-01-31 NOTE — Telephone Encounter (Signed)
Cbc, CMET, INR

## 2014-02-01 ENCOUNTER — Other Ambulatory Visit: Payer: Self-pay | Admitting: Gastroenterology

## 2014-02-08 ENCOUNTER — Other Ambulatory Visit: Payer: Self-pay | Admitting: Gastroenterology

## 2014-03-06 ENCOUNTER — Other Ambulatory Visit (INDEPENDENT_AMBULATORY_CARE_PROVIDER_SITE_OTHER): Payer: BC Managed Care – PPO

## 2014-03-06 DIAGNOSIS — K754 Autoimmune hepatitis: Secondary | ICD-10-CM

## 2014-03-06 DIAGNOSIS — K5010 Crohn's disease of large intestine without complications: Secondary | ICD-10-CM

## 2014-03-06 LAB — CBC WITH DIFFERENTIAL/PLATELET
BASOS PCT: 0.5 % (ref 0.0–3.0)
Basophils Absolute: 0 10*3/uL (ref 0.0–0.1)
EOS ABS: 0.2 10*3/uL (ref 0.0–0.7)
Eosinophils Relative: 3.8 % (ref 0.0–5.0)
HCT: 44.8 % (ref 36.0–46.0)
Hemoglobin: 14.5 g/dL (ref 12.0–15.0)
LYMPHS PCT: 25.2 % (ref 12.0–46.0)
Lymphs Abs: 1.6 10*3/uL (ref 0.7–4.0)
MCHC: 32.4 g/dL (ref 30.0–36.0)
MCV: 84.1 fl (ref 78.0–100.0)
Monocytes Absolute: 0.4 10*3/uL (ref 0.1–1.0)
Monocytes Relative: 6.7 % (ref 3.0–12.0)
Neutro Abs: 4.1 10*3/uL (ref 1.4–7.7)
Neutrophils Relative %: 63.8 % (ref 43.0–77.0)
Platelets: 229 10*3/uL (ref 150.0–400.0)
RBC: 5.32 Mil/uL — ABNORMAL HIGH (ref 3.87–5.11)
RDW: 13.2 % (ref 11.5–15.5)
WBC: 6.4 10*3/uL (ref 4.0–10.5)

## 2014-03-06 LAB — COMPREHENSIVE METABOLIC PANEL
ALT: 14 U/L (ref 0–35)
AST: 19 U/L (ref 0–37)
Albumin: 4.3 g/dL (ref 3.5–5.2)
Alkaline Phosphatase: 99 U/L (ref 39–117)
BUN: 14 mg/dL (ref 6–23)
CO2: 22 mEq/L (ref 19–32)
Calcium: 9.8 mg/dL (ref 8.4–10.5)
Chloride: 105 mEq/L (ref 96–112)
Creatinine, Ser: 0.7 mg/dL (ref 0.4–1.2)
GFR: 84.89 mL/min (ref >60.00)
Glucose, Bld: 91 mg/dL (ref 70–99)
Potassium: 4.8 mEq/L (ref 3.5–5.1)
Sodium: 141 mEq/L (ref 135–145)
Total Bilirubin: 0.5 mg/dL (ref 0.2–1.2)
Total Protein: 7.5 g/dL (ref 6.0–8.3)

## 2014-03-06 LAB — PROTIME-INR
INR: 0.9 ratio (ref 0.8–1.0)
PROTHROMBIN TIME: 10.5 s (ref 9.6–13.1)

## 2014-03-07 NOTE — Progress Notes (Signed)
Quick Note:  Please inform the patient that lab work was normal and to continue current plan of action ______ 

## 2014-03-29 ENCOUNTER — Encounter: Payer: Self-pay | Admitting: Gastroenterology

## 2014-03-29 ENCOUNTER — Ambulatory Visit (INDEPENDENT_AMBULATORY_CARE_PROVIDER_SITE_OTHER): Payer: BC Managed Care – PPO | Admitting: Gastroenterology

## 2014-03-29 ENCOUNTER — Other Ambulatory Visit: Payer: Self-pay | Admitting: Gastroenterology

## 2014-03-29 VITALS — BP 152/94 | HR 78 | Ht 67.0 in | Wt 203.4 lb

## 2014-03-29 DIAGNOSIS — Z23 Encounter for immunization: Secondary | ICD-10-CM

## 2014-03-29 DIAGNOSIS — R197 Diarrhea, unspecified: Secondary | ICD-10-CM

## 2014-03-29 DIAGNOSIS — K5190 Ulcerative colitis, unspecified, without complications: Secondary | ICD-10-CM

## 2014-03-29 DIAGNOSIS — D126 Benign neoplasm of colon, unspecified: Secondary | ICD-10-CM

## 2014-03-29 DIAGNOSIS — K754 Autoimmune hepatitis: Secondary | ICD-10-CM

## 2014-03-29 MED ORDER — DICYCLOMINE HCL 10 MG PO CAPS: 10.0000 mg | ORAL_CAPSULE | Freq: Three times a day (TID) | ORAL | Status: DC

## 2014-03-29 NOTE — Assessment & Plan Note (Signed)
She remains in clinical remission off all medications.  No inflammation was seen at last colonoscopy in 2012.  Plan follow-up colonoscopy in 2017

## 2014-03-29 NOTE — Progress Notes (Signed)
      History of Present Illness:  Kristina Moore has returned for follow-up of autoimmune hepatitis and ulcerative colitis.  Both are in remission.  She has no GI complaints.  Recent blood work was entirely normal.  Adenomatous polyps were removed in 2007.  2012 colonoscopy was normal except for hemorrhoids and diverticulosis.  Biopsies were negative for dysplasia and inflammation.  She stopped smoking 2 years ago.    Review of Systems: She complains of bilateral knee pain.  Pertinent positive and negative review of systems were noted in the above HPI section. All other review of systems were otherwise negative.    Current Medications, Allergies, Past Medical History, Past Surgical History, Family History and Social History were reviewed in [facility] record  Vital signs were reviewed in today's medical record. Physical Exam: General: Well developed , well nourished, no acute distress Skin: anicteric Head: Normocephalic and atraumatic Eyes:  sclerae anicteric, EOMI Ears: Normal auditory acuity Mouth: No deformity or lesions Lungs: Clear throughout to auscultation Heart: Regular rate and rhythm; no murmurs, rubs or bruits Abdomen: Soft, non tender and non distended. No masses, hepatosplenomegaly or hernias noted. Normal Bowel sounds Rectal:deferred Musculoskeletal: Symmetrical with no gross deformities  Pulses:  Normal pulses noted Extremities: No clubbing, cyanosis, edema or deformities noted Neurological: Alert oriented x 4, grossly nonfocal Psychological:  Alert and cooperative. Normal mood and affect  See Assessment and Plan under Problem List

## 2014-03-29 NOTE — Assessment & Plan Note (Signed)
Plan follow-up colonoscopy 2017 

## 2014-03-29 NOTE — Assessment & Plan Note (Signed)
LFT is normal.  Serologies for hepatitis A, B, and C are negative.  Recommendations #1 vaccinations for hepatitis A and B

## 2014-03-29 NOTE — Addendum Note (Signed)
Addended by: Oda Kilts on: 03/29/2014 11:10 AM   Modules accepted: Orders

## 2014-03-29 NOTE — Addendum Note (Signed)
Addended by: Oda Kilts on: 03/29/2014 11:16 AM   Modules accepted: Orders

## 2014-03-29 NOTE — Patient Instructions (Addendum)
You will need to follow up in one year  You will receive Hep A/B  Vaccine today  You will need a second Hep injection in 1 week   04/05/2014 at 10am

## 2014-04-05 ENCOUNTER — Ambulatory Visit (INDEPENDENT_AMBULATORY_CARE_PROVIDER_SITE_OTHER): Payer: BC Managed Care – PPO | Admitting: Gastroenterology

## 2014-04-05 DIAGNOSIS — Z23 Encounter for immunization: Secondary | ICD-10-CM

## 2014-04-05 DIAGNOSIS — K754 Autoimmune hepatitis: Secondary | ICD-10-CM

## 2014-04-05 DIAGNOSIS — D126 Benign neoplasm of colon, unspecified: Secondary | ICD-10-CM

## 2014-04-20 ENCOUNTER — Other Ambulatory Visit: Payer: Self-pay | Admitting: Gastroenterology

## 2014-04-25 NOTE — Telephone Encounter (Signed)
Med faxed to pharmacy  ok'd by Dr Deatra Ina

## 2014-04-28 ENCOUNTER — Ambulatory Visit (INDEPENDENT_AMBULATORY_CARE_PROVIDER_SITE_OTHER): Payer: BLUE CROSS/BLUE SHIELD | Admitting: Gastroenterology

## 2014-04-28 DIAGNOSIS — Z23 Encounter for immunization: Secondary | ICD-10-CM

## 2014-04-28 DIAGNOSIS — D126 Benign neoplasm of colon, unspecified: Secondary | ICD-10-CM

## 2014-04-28 DIAGNOSIS — K754 Autoimmune hepatitis: Secondary | ICD-10-CM

## 2014-06-07 ENCOUNTER — Other Ambulatory Visit: Payer: Self-pay | Admitting: Gastroenterology

## 2014-06-14 ENCOUNTER — Other Ambulatory Visit: Payer: Self-pay | Admitting: Gastroenterology

## 2014-08-15 ENCOUNTER — Other Ambulatory Visit: Payer: Self-pay | Admitting: Gastroenterology

## 2014-08-17 NOTE — Telephone Encounter (Signed)
Called rx into patients pharmacy   They never received fax from Dr Deatra Ina

## 2015-02-05 ENCOUNTER — Other Ambulatory Visit: Payer: Self-pay | Admitting: Gastroenterology

## 2015-02-23 ENCOUNTER — Telehealth: Payer: Self-pay | Admitting: Gastroenterology

## 2015-02-23 NOTE — Telephone Encounter (Signed)
L./M for patient that next immunization is due 04/29/2015 and she needs to call the office to schedule an appointment with a new provider to get medication refills

## 2015-04-04 ENCOUNTER — Telehealth: Payer: Self-pay | Admitting: *Deleted

## 2015-04-04 NOTE — Telephone Encounter (Signed)
L/M for patient to call the office to schedule an appointment to have 1 year follow up twinrix done.

## 2015-04-27 ENCOUNTER — Ambulatory Visit (INDEPENDENT_AMBULATORY_CARE_PROVIDER_SITE_OTHER): Payer: BLUE CROSS/BLUE SHIELD | Admitting: Gastroenterology

## 2015-04-27 DIAGNOSIS — Z23 Encounter for immunization: Secondary | ICD-10-CM | POA: Diagnosis not present

## 2015-04-27 DIAGNOSIS — D126 Benign neoplasm of colon, unspecified: Secondary | ICD-10-CM | POA: Diagnosis not present

## 2015-04-27 DIAGNOSIS — K754 Autoimmune hepatitis: Secondary | ICD-10-CM

## 2015-08-15 ENCOUNTER — Encounter: Payer: Self-pay | Admitting: Gastroenterology

## 2015-08-15 ENCOUNTER — Ambulatory Visit (INDEPENDENT_AMBULATORY_CARE_PROVIDER_SITE_OTHER): Payer: BLUE CROSS/BLUE SHIELD | Admitting: Gastroenterology

## 2015-08-15 ENCOUNTER — Other Ambulatory Visit (INDEPENDENT_AMBULATORY_CARE_PROVIDER_SITE_OTHER): Payer: BLUE CROSS/BLUE SHIELD

## 2015-08-15 VITALS — BP 156/108 | HR 80 | Ht 67.0 in | Wt 199.8 lb

## 2015-08-15 DIAGNOSIS — K754 Autoimmune hepatitis: Secondary | ICD-10-CM

## 2015-08-15 DIAGNOSIS — F4329 Adjustment disorder with other symptoms: Secondary | ICD-10-CM

## 2015-08-15 LAB — HEPATIC FUNCTION PANEL
ALBUMIN: 4.3 g/dL (ref 3.5–5.2)
ALT: 27 U/L (ref 0–35)
AST: 17 U/L (ref 0–37)
Alkaline Phosphatase: 103 U/L (ref 39–117)
Bilirubin, Direct: 0 mg/dL (ref 0.0–0.3)
Total Bilirubin: 0.3 mg/dL (ref 0.2–1.2)
Total Protein: 7.4 g/dL (ref 6.0–8.3)

## 2015-08-15 LAB — COMPREHENSIVE METABOLIC PANEL
ALK PHOS: 103 U/L (ref 39–117)
ALT: 27 U/L (ref 0–35)
AST: 17 U/L (ref 0–37)
Albumin: 4.3 g/dL (ref 3.5–5.2)
BUN: 17 mg/dL (ref 6–23)
CO2: 28 meq/L (ref 19–32)
Calcium: 9.5 mg/dL (ref 8.4–10.5)
Chloride: 104 mEq/L (ref 96–112)
Creatinine, Ser: 0.71 mg/dL (ref 0.40–1.20)
GFR: 88.62 mL/min (ref >60.00)
GLUCOSE: 92 mg/dL (ref 70–99)
POTASSIUM: 4 meq/L (ref 3.5–5.1)
SODIUM: 140 meq/L (ref 135–145)
TOTAL PROTEIN: 7.4 g/dL (ref 6.0–8.3)
Total Bilirubin: 0.3 mg/dL (ref 0.2–1.2)

## 2015-08-15 LAB — CBC WITH DIFFERENTIAL/PLATELET
BASOS ABS: 0 10*3/uL (ref 0.0–0.1)
Basophils Relative: 0.5 % (ref 0.0–3.0)
Eosinophils Absolute: 0.2 10*3/uL (ref 0.0–0.7)
Eosinophils Relative: 3.1 % (ref 0.0–5.0)
HCT: 40.5 % (ref 36.0–46.0)
Hemoglobin: 13.4 g/dL (ref 12.0–15.0)
LYMPHS ABS: 2 10*3/uL (ref 0.7–4.0)
Lymphocytes Relative: 29.4 % (ref 12.0–46.0)
MCHC: 33.2 g/dL (ref 30.0–36.0)
MCV: 81.4 fl (ref 78.0–100.0)
MONO ABS: 0.4 10*3/uL (ref 0.1–1.0)
Monocytes Relative: 6.3 % (ref 3.0–12.0)
NEUTROS PCT: 60.7 % (ref 43.0–77.0)
Neutro Abs: 4.1 10*3/uL (ref 1.4–7.7)
PLATELETS: 252 10*3/uL (ref 150.0–400.0)
RBC: 4.97 Mil/uL (ref 3.87–5.11)
RDW: 13.9 % (ref 11.5–15.5)
WBC: 6.7 10*3/uL (ref 4.0–10.5)

## 2015-08-15 MED ORDER — DIAZEPAM 10 MG PO TABS: 10.0000 mg | ORAL_TABLET | Freq: Four times a day (QID) | ORAL | Status: AC | PRN

## 2015-08-15 NOTE — Progress Notes (Signed)
Kristina Moore    [id number]    1955-08-09  Primary Care Physician:No PCP Per Patient  Referring Physician: No referring provider defined for this encounter.  Chief complaint:  Autoimmune hepatitis   HPI: 60 year old female previously followed by Dr. Deatra Ina with autoimmune hepatitis and also history of colitis based on colonoscopy in 2007, but subsequent colonoscopy in 2012 did not show any evidence of inflammation treat bowel disease. Per patient she had a liver biopsy done somewhere in [place] many years ago which confirmed the diagnosis of autoimmune hepatitis, reports not available during this office visit. Her LFTs have been stable within normal limits in the past few years. Hep A,B and C negative; she is now status post immunization for hepatitis in B. Denies any nausea, vomiting, abdominal pain, melena or bright red blood per rectum Patient is requesting refill for Valium to help with her anxiety, she takes it only as needed.    Outpatient Encounter Prescriptions as of 08/15/2015  Medication Sig  . diazepam (VALIUM) 10 MG tablet TAKE 1 TABLET BY MOUTH EVERY 6 HOURS AS NEEDED  . [DISCONTINUED] dicyclomine (BENTYL) 10 MG capsule Take 1 capsule (10 mg total) by mouth 4 (four) times daily -  before meals and at bedtime.   No facility-administered encounter medications on file as of 08/15/2015.    Allergies as of 08/15/2015  . (No Known Allergies)    Past Medical History  Diagnosis Date  . Internal hemorrhoids without mention of complication   . Benign neoplasm of colon   . Diverticulosis of colon (without mention of hemorrhage)   . Other and unspecified noninfectious gastroenteritis and colitis(558.9)   . Colitis   . Autoimmune hepatitis ([place])   . Emphysema of lung ([place])   . Inflammatory bowel disease   . Ulcerative colitis   . Ulcerative colitis   . Autoimmune hepatitis [facility])     Past Surgical History  Procedure Laterality Date  . Abdominal hysterectomy     . Knee arthroscopy    . Liver biopsy    . Colonoscopy    . Polypectomy      Family History  Problem Relation Age of Onset  . Uterine cancer Mother   . Diverticulosis Mother   . Colon cancer Neg Hx   . Cystic fibrosis Mother     Social History   Social History  . Marital Status: Single    Spouse Name: N/A  . Number of Children: 2  . Years of Education: N/A   Occupational History  . Sales   .     Social History Main Topics  . Smoking status: Former Smoker -- 0.50 packs/day    Types: Cigarettes    Quit date: 02/20/2012  . Smokeless tobacco: Never Used  . Alcohol Use: Yes  . Drug Use: No  . Sexual Activity: Not on file   Other Topics Concern  . Not on file   Social History Narrative      Review of systems: Review of Systems  Constitutional: Negative for fever and chills.  HENT: Negative.   Eyes: Negative for blurred vision.  Respiratory: Negative for cough, shortness of breath and wheezing.   Cardiovascular: Negative for chest pain and palpitations.  Gastrointestinal: as per HPI Genitourinary: Negative for dysuria, urgency, frequency and hematuria.  Musculoskeletal: Negative for myalgias, back pain and joint pain.  Skin: Negative for itching and rash.  Neurological: Negative for dizziness, tremors, focal weakness, seizures and loss  of consciousness.  Endo/Heme/Allergies: Negative for environmental allergies.  Psychiatric/Behavioral: Negative for depression, suicidal ideas and hallucinations.  All other systems reviewed and are negative.   Physical Exam: Filed Vitals:   08/15/15 1339  BP: 156/108  Pulse: 80   Gen:      No acute distress HEENT:  EOMI, sclera anicteric Neck:     No masses; no thyromegaly Lungs:    Clear to auscultation bilaterally; normal respiratory effort CV:         Regular rate and rhythm; no murmurs Abd:      + bowel sounds; soft, non-tender; no palpable masses, no distension Ext:    No edema; adequate peripheral  perfusion Skin:      Warm and dry; no rash Neuro: alert and oriented x 3 Psych: normal mood and affect  Data Reviewed:  Reviewed chart in epic   Assessment and Plan/Recommendations:  60 year old female with history of autoimmune hepatitis and evidence of colitis in 2007 with subsequent colonoscopy in 2012 was unremarkable We will check CBC, BMP, LFT, ANA and anti-smooth muscle antibody Due for surveillance colonoscopy in August 2017 per her last colonoscopy report Patient was given prescription for 10 mg Valium 30 pills to prevent acute benzodiazepine withdrawal and she was given information regarding behavior health specialists clinics in the area that she can call to make appointment to manage her anxiety disorder. Informed patient that I will not continue to manage her anxiety and do any further refills on Valium  K. Denzil Magnuson , MD [phone] Mon-Fri 8a-5p [phone] after 5p, weekends, holidays

## 2015-08-15 NOTE — Patient Instructions (Addendum)
Go to the basement for labs today Follow up in 1 year We refilled your Valium today with no refills Your recall colonoscopy is for 11/2015

## 2015-08-16 LAB — ANA: Anti Nuclear Antibody(ANA): POSITIVE — AB

## 2015-08-16 LAB — ANTI-NUCLEAR AB-TITER (ANA TITER): ANA Titer 1: 1:320 {titer} — ABNORMAL HIGH

## 2015-08-17 LAB — ANTI-SMOOTH MUSCLE ANTIBODY, IGG: Smooth Muscle Ab: 41 U — ABNORMAL HIGH (ref <20)

## 2015-10-09 ENCOUNTER — Encounter: Payer: Self-pay | Admitting: Gastroenterology

## 2018-12-10 DIAGNOSIS — I10 Essential (primary) hypertension: Secondary | ICD-10-CM | POA: Insufficient documentation

## 2018-12-10 DIAGNOSIS — E7849 Other hyperlipidemia: Secondary | ICD-10-CM | POA: Insufficient documentation

## 2018-12-10 DIAGNOSIS — F419 Anxiety disorder, unspecified: Secondary | ICD-10-CM | POA: Insufficient documentation

## 2020-08-24 DIAGNOSIS — M1712 Unilateral primary osteoarthritis, left knee: Secondary | ICD-10-CM | POA: Insufficient documentation

## 2021-05-10 DIAGNOSIS — N819 Female genital prolapse, unspecified: Secondary | ICD-10-CM | POA: Insufficient documentation

## 2021-05-10 DIAGNOSIS — L900 Lichen sclerosus et atrophicus: Secondary | ICD-10-CM | POA: Insufficient documentation

## 2021-05-10 DIAGNOSIS — N952 Postmenopausal atrophic vaginitis: Secondary | ICD-10-CM | POA: Insufficient documentation

## 2021-06-07 DIAGNOSIS — Z960 Presence of urogenital implants: Secondary | ICD-10-CM | POA: Insufficient documentation

## 2021-06-07 DIAGNOSIS — R5383 Other fatigue: Secondary | ICD-10-CM | POA: Insufficient documentation

## 2021-12-02 DIAGNOSIS — F411 Generalized anxiety disorder: Secondary | ICD-10-CM | POA: Insufficient documentation

## 2021-12-02 DIAGNOSIS — F339 Major depressive disorder, recurrent, unspecified: Secondary | ICD-10-CM | POA: Insufficient documentation

## 2021-12-02 DIAGNOSIS — M25551 Pain in right hip: Secondary | ICD-10-CM | POA: Insufficient documentation

## 2022-03-07 DIAGNOSIS — K219 Gastro-esophageal reflux disease without esophagitis: Secondary | ICD-10-CM | POA: Insufficient documentation

## 2022-03-13 DIAGNOSIS — E559 Vitamin D deficiency, unspecified: Secondary | ICD-10-CM | POA: Insufficient documentation

## 2023-03-28 ENCOUNTER — Ambulatory Visit
Admission: EM | Admit: 2023-03-28 | Discharge: 2023-03-28 | Disposition: A | Discharge status: Home / Self Care | Payer: 59

## 2023-03-28 DIAGNOSIS — J069 Acute upper respiratory infection, unspecified: Secondary | ICD-10-CM | POA: Diagnosis not present

## 2023-03-28 LAB — POC COVID19/FLU A&B COMBO
Covid Antigen, POC: NEGATIVE
Influenza A Antigen, POC: NEGATIVE
Influenza B Antigen, POC: NEGATIVE

## 2023-03-28 NOTE — ED Triage Notes (Signed)
Exposed to grandson who has had the same thing "Cough with congestion that started Friday morning". "I also have a sore throat". No fever.

## 2023-04-01 NOTE — ED Provider Notes (Signed)
[facility]    CSN: [id number] Arrival date & time: 03/28/23  1012      History   Chief Complaint Chief Complaint  Patient presents with   Sore Throat   Cough    HPI Eshaal Skillings is a 67 y.o. female.   Patient here today for evaluation of cough congestion that started a few days ago.  She has also had some sore throat.  She denies any fever.  She has not had any vomiting or diarrhea.  Lucila Maine is sick with similar symptoms.  The history is provided by the patient.  Sore Throat Pertinent negatives include no abdominal pain and no shortness of breath.  Cough Associated symptoms: sore throat   Associated symptoms: no chills, no ear pain, no eye discharge, no fever, no shortness of breath and no wheezing     Past Medical History:  Diagnosis Date   Autoimmune hepatitis (HCC)    Autoimmune hepatitis (HCC)    Benign neoplasm of colon    Colitis    Diverticulosis of colon (without mention of hemorrhage)    Emphysema of lung (HCC)    Inflammatory bowel disease    Internal hemorrhoids without mention of complication    Other and unspecified noninfectious gastroenteritis and colitis(558.9)    Ulcerative colitis    Ulcerative colitis     Patient Active Problem List   Diagnosis Date Noted   Vitamin D deficiency 03/13/2022   Gastroesophageal reflux disease 03/07/2022   Bilateral hip pain 12/02/2021   Episode of recurrent major depressive disorder (HCC) 12/02/2021   GAD (generalized anxiety disorder) 12/02/2021   Other fatigue 06/07/2021   Vaginal pessary present 06/07/2021   Lichen sclerosus 05/10/2021   Vaginal atrophy 05/10/2021   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      Home Medications    Prior to Admission medications   Medication Sig Start Date End Date Taking? Authorizing Provider  atorvastatin (LIPITOR) 40 MG tablet Take 1 tablet by mouth at bedtime. 02/28/23   [provider]  clobetasol ointment (TEMOVATE) 0.05 % Apply 1 Application topically 2 (two) times daily. 09/06/21   [provider]  diazepam (VALIUM) 10 MG tablet Take 1 tablet (10 mg total) by mouth every 6 (six) hours as needed for anxiety. 08/15/15   Napoleon Form, MD  ERGOCALCIFEROL PO Take 1 capsule by mouth once a week. 03/07/22   [provider]  estradiol (ESTRACE) 0.1 MG/GM vaginal cream Place 1 Applicatorful vaginally as directed. 07/29/22   [provider]  ibuprofen (ADVIL) 800 MG tablet Take 800 mg by mouth every 6 (six) hours as needed.    [provider]  lisinopril-hydrochlorothiazide (ZESTORETIC) 20-25 MG tablet Take 1 tablet by mouth daily.    [provider]  pantoprazole (PROTONIX) 40 MG tablet Take 40 mg by mouth daily.    [provider]  sertraline (ZOLOFT) 25 MG tablet Take  25 mg by mouth daily.    [provider]    Family History Family History  Problem Relation Age of Onset   Uterine cancer Mother    Diverticulosis Mother    Colon cancer Neg Hx    Cystic fibrosis Mother     Social History Social History   Tobacco Use   Smoking status: Former    Current packs/day: 0.00    Types: Cigarettes    Quit date: 02/20/2012    Years since quitting: 11.1   Smokeless tobacco: Never  Vaping Use   Vaping status: Never Used  Substance Use Topics   Alcohol use: Yes   Drug use: No     Allergies   Patient has no known allergies.   Review of  Systems Review of Systems  Constitutional:  Negative for chills and fever.  HENT:  Positive for congestion and sore throat. Negative for ear pain.   Eyes:  Negative for discharge and redness.  Respiratory:  Positive for cough. Negative for shortness of breath and wheezing.   Gastrointestinal:  Negative for abdominal pain, diarrhea, nausea and vomiting.     Physical Exam Triage Vital Signs ED Triage Vitals  Encounter Vitals Group     BP 03/28/23 1058 137/80     Systolic BP Percentile --      Diastolic BP Percentile --      Pulse Rate 03/28/23 1058 94     Resp 03/28/23 1058 18     Temp 03/28/23 1058 97.7 F (36.5 C)     Temp Source 03/28/23 1058 Oral     SpO2 03/28/23 1058 98 %     Weight 03/28/23 1056 199 lb 11.8 oz (90.6 kg)     Height 03/28/23 1056 5\' 7"  (1.702 m)     Head Circumference --      Peak Flow --      Pain Score 03/28/23 1051 0     Pain Loc --      Pain Education --      Exclude from Growth Chart --    No data found.  Updated Vital Signs BP 137/80 (BP Location: Left Arm)   Pulse 94   Temp 97.7 F (36.5 C) (Oral)   Resp 18   Ht 5\' 7"  (1.702 m)   Wt 199 lb 11.8 oz (90.6 kg)   SpO2 98%   BMI 31.28 kg/m   Visual Acuity Right Eye Distance:   Left Eye Distance:   Bilateral Distance:    Right Eye Near:   Left Eye Near:    Bilateral Near:     Physical Exam Vitals and nursing note reviewed.  Constitutional:      General: She is not in acute distress.    Appearance: Normal appearance. She is not ill-appearing.  HENT:     Head: Normocephalic and atraumatic.     Nose: Congestion present.     Mouth/Throat:     Mouth: Mucous membranes are moist.     Pharynx: No oropharyngeal exudate or posterior oropharyngeal erythema.  Eyes:     Conjunctiva/sclera: Conjunctivae normal.  Cardiovascular:     Rate and Rhythm: Normal rate and regular rhythm.     Heart sounds: Normal heart sounds. No murmur heard. Pulmonary:     Effort: Pulmonary effort is normal. No  respiratory distress.     Breath sounds: Normal breath sounds. No wheezing, rhonchi or rales.  Skin:    General: Skin is warm and dry.  Neurological:  Mental Status: She is alert.  Psychiatric:        Mood and Affect: Mood normal.        Thought Content: Thought content normal.      UC Treatments / Results  Labs (all labs ordered are listed, but only abnormal results are displayed) Labs Reviewed  POC COVID19/FLU A&B COMBO    EKG   Radiology No results found.  Procedures Procedures (including critical care time)  Medications Ordered in UC Medications - No data to display  Initial Impression / Assessment and Plan / UC Course  I have reviewed the triage vital signs and the nursing notes.  Pertinent labs & imaging results that were available during my care of the patient were reviewed by me and considered in my medical decision making (see chart for details).    Suspect viral etiology of symptoms.  Point-of-care flu and COVID screen negative.  Recommended symptomatic treatment, increase fluids and rest with follow-up if no gradual improvement or with any further concerns.  Final Clinical Impressions(s) / UC Diagnoses   Final diagnoses:  Acute upper respiratory infection   Discharge Instructions   None    ED Prescriptions   None    PDMP not reviewed this encounter.   Tomi Bamberger, PA-C 04/01/23 1623

## 2023-08-13 LAB — COLOGUARD: COLOGUARD: NEGATIVE

## 2024-05-08 ENCOUNTER — Ambulatory Visit
Admission: EM | Admit: 2024-05-08 | Discharge: 2024-05-08 | Disposition: A | Discharge status: Home / Self Care | Attending: Student | Admitting: Student

## 2024-05-08 ENCOUNTER — Encounter: Payer: Self-pay | Admitting: Emergency Medicine

## 2024-05-08 ENCOUNTER — Ambulatory Visit (INDEPENDENT_AMBULATORY_CARE_PROVIDER_SITE_OTHER)

## 2024-05-08 DIAGNOSIS — J069 Acute upper respiratory infection, unspecified: Secondary | ICD-10-CM

## 2024-05-08 DIAGNOSIS — J439 Emphysema, unspecified: Secondary | ICD-10-CM | POA: Diagnosis not present

## 2024-05-08 LAB — POC SOFIA SARS ANTIGEN FIA: SARS Coronavirus 2 Ag: NEGATIVE

## 2024-05-08 LAB — POCT INFLUENZA A/B
Influenza A, POC: NEGATIVE
Influenza B, POC: NEGATIVE

## 2024-05-08 MED ORDER — IPRATROPIUM-ALBUTEROL 0.5-2.5 (3) MG/3ML IN SOLN
3.0000 mL | Freq: Once | RESPIRATORY_TRACT | Status: AC
  Administered 2024-05-08: 3 mL via RESPIRATORY_TRACT

## 2024-05-08 MED ORDER — AZITHROMYCIN 250 MG PO TABS: 250.0000 mg | ORAL_TABLET | Freq: Every day | ORAL | 0 refills | Status: DC

## 2024-05-08 MED ORDER — PREDNISONE 20 MG PO TABS: 40.0000 mg | ORAL_TABLET | Freq: Every day | ORAL | 0 refills | Status: AC

## 2024-05-08 MED ORDER — ACETAMINOPHEN 325 MG PO TABS
650.0000 mg | ORAL_TABLET | Freq: Once | ORAL | Status: AC
  Administered 2024-05-08: 650 mg via ORAL

## 2024-05-08 NOTE — ED Triage Notes (Addendum)
 Patient vomit up foam this morning and has had shortness of breathe, cough and body aches.  Patient denies fever.  Patient grandson was sick and got diagnosis with a virus.  Patent took some medication for the cough however it is not working.    Patient having chest pain with the cough

## 2024-05-08 NOTE — Discharge Instructions (Addendum)
-  Your chest x-ray looks okay to me, but if the radiologist sees something that I missed, I will call within about 90 minutes. -We are covering for both infection and inflammation with an antibiotic and a steroid. -Start the azithromycin , which you will take for 5 days. -Prednisone , 2 pills taken at the same time for 5 days in a row.  Try taking this earlier in the day as it can give you energy. Avoid NSAIDs like ibuprofen and alleve while taking this medication as they can increase your risk of stomach upset and even GI bleeding when in combination with a steroid. You can continue tylenol  (acetaminophen ) up to 1000mg  3x daily. -Start using the daily inhaler, as well as the albuterol . -Albuterol  inhaler as needed for cough, wheezing, shortness of breath, 1 to 2 puffs every 6 hours as needed. -Your COVID and influenza tests were negative. ---->You have a virus, like the common cold.  Viruses typically last 5 to 7 days.  After 7 days, your symptoms should be improving rather than worsening.  If your symptoms improve, and then worsen again, this is when we worry about a sinus infection or a lung infection, and you should return for additional care.

## 2024-05-08 NOTE — ED Provider Notes (Signed)
 " [facility]    CSN: [id number] Arrival date & time: 05/08/24  1024      History   Chief Complaint Chief Complaint  Patient presents with   Cough   Nasal Congestion   Emesis   Shortness of Breath    HPI Kristina Moore is a 69 y.o. female.  -Endorses: nausea with vomiting, cough that is productive of yellow sputum, SOB (worse with exertion), myalgias, chills, chest wall pain with coughing. -Highest fever: none -Denies: chest pain at rest, weakness.  -Duration of symptoms: today is day 2 -Interventions attempted: Mucinex (ineffective), ibuprofen (last 16 hours ago) -The patient denies a history of pulmonary disease; chart review indicates emphysema. She has two inhalers (albuterol  and ?), she is not using them regularly, she took one puff of albuterol  1 day ago and was minimally effective.     HPI  Past Medical History:  Diagnosis Date   Autoimmune hepatitis (HCC)    Autoimmune hepatitis (HCC)    Benign neoplasm of colon    Colitis    Diverticulosis of colon (without mention of hemorrhage)    Emphysema of lung (HCC)    Inflammatory bowel disease    Internal hemorrhoids without mention of complication    Other and unspecified noninfectious gastroenteritis and colitis(558.9)    Ulcerative colitis    Ulcerative colitis     Patient Active Problem List   Diagnosis Date Noted   Vitamin D deficiency 03/13/2022   Gastroesophageal reflux disease 03/07/2022   Bilateral hip pain 12/02/2021   Episode of recurrent major depressive disorder 12/02/2021   GAD (generalized anxiety disorder) 12/02/2021   Other fatigue 06/07/2021   Vaginal pessary present 06/07/2021   Lichen sclerosus 05/10/2021   Vaginal atrophy 05/10/2021   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      Home Medications    Prior to Admission medications  Medication Sig Start Date End Date Taking? Authorizing Provider  azithromycin  (ZITHROMAX ) 250 MG tablet Take 1 tablet (250 mg total) by mouth daily. Take first 2 tablets together, then 1 every day until finished. 05/08/24  Yes Westlynn Fifer E, PA-C  predniSONE  (DELTASONE ) 20 MG tablet Take 2 tablets (40 mg total) by mouth daily for 5 days. Take with breakfast or lunch. Avoid NSAIDs (ibuprofen, etc) while taking this medication. 05/08/24 05/13/24 Yes Maxamilian Amadon E, PA-C  atorvastatin (LIPITOR) 40 MG tablet Take 1 tablet by mouth at bedtime. 02/28/23   [provider]  clobetasol ointment (TEMOVATE) 0.05 % Apply 1 Application topically 2 (two) times daily. 09/06/21   [provider]  diazepam  (VALIUM ) 10 MG tablet Take 1 tablet (10 mg total) by mouth every 6 (six) hours as needed for anxiety. 08/15/15   Nandigam, Kavitha V, MD  ERGOCALCIFEROL PO Take 1 capsule by mouth once a week. 03/07/22   [provider]  estradiol (ESTRACE) 0.1 MG/GM vaginal cream Place 1 Applicatorful vaginally as  directed. 07/29/22   [provider]  ibuprofen (ADVIL) 800 MG tablet Take 800 mg by mouth every 6 (six) hours as needed.    [provider]  lisinopril-hydrochlorothiazide (ZESTORETIC) 20-25 MG tablet Take 1 tablet by mouth daily.    [provider]  pantoprazole (PROTONIX) 40 MG tablet Take 40 mg by mouth daily.    [provider]  sertraline (ZOLOFT) 25 MG tablet Take 25 mg by mouth daily.    [provider]    Family History Family History  Problem Relation Age of  Onset   Uterine cancer Mother    Diverticulosis Mother    Colon cancer Neg Hx    Cystic fibrosis Mother     Social History Social History[1]   Allergies   Patient has no known allergies.   Review of Systems Review of Systems  Constitutional:  Positive for chills. Negative for appetite change and fever.  HENT:  Positive for congestion and sore throat. Negative for ear pain, rhinorrhea, sinus pressure and sinus pain.   Eyes:  Negative for redness and visual disturbance.  Respiratory:  Positive for cough. Negative for chest tightness, shortness of breath and wheezing.   Cardiovascular:  Negative for chest pain and palpitations.  Gastrointestinal:  Positive for nausea and vomiting. Negative for abdominal pain, constipation and diarrhea.  Genitourinary:  Negative for dysuria, frequency and urgency.  Musculoskeletal:  Positive for myalgias.  Neurological:  Negative for dizziness, weakness and headaches.  Psychiatric/Behavioral:  Negative for confusion.   All other systems reviewed and are negative.    Physical Exam Triage Vital Signs ED Triage Vitals  Encounter Vitals Group     BP 05/08/24 1124 (!) 141/84     Girls Systolic BP Percentile --      Girls Diastolic BP Percentile --      Boys Systolic BP Percentile --      Boys Diastolic BP Percentile --      Pulse Rate 05/08/24 1124 (!) 122     Resp 05/08/24 1124 20     Temp 05/08/24 1124 99.5 F (37.5 C)     Temp Source 05/08/24 1124 Oral     SpO2 05/08/24 1124 94 %     Weight --      Height --      Head Circumference --      Peak Flow --      Pain Score 05/08/24 1123 8     Pain Loc --      Pain Education --      Exclude from Growth Chart --    No data found.  Updated Vital Signs BP (!) 141/84 (BP Location: Left Arm)   Pulse (!) 124   Temp 98.6 F (37 C)   Resp 20   SpO2 93%   Visual Acuity Right Eye Distance:   Left Eye Distance:   Bilateral Distance:    Right Eye Near:   Left Eye Near:    Bilateral  Near:     Physical Exam Vitals reviewed.  Constitutional:      General: She is not in acute distress.    Appearance: Normal appearance. She is not ill-appearing.  HENT:     Head: Normocephalic and atraumatic.     Right Ear: Tympanic membrane, ear canal and external ear normal. No tenderness. No middle ear effusion. There is no impacted cerumen. Tympanic membrane is not perforated, erythematous, retracted or bulging.     Left Ear: Tympanic membrane, ear canal and external  ear normal. No tenderness.  No middle ear effusion. There is no impacted cerumen. Tympanic membrane is not perforated, erythematous, retracted or bulging.     Nose: Nose normal. No congestion.     Mouth/Throat:     Mouth: Mucous membranes are moist.     Pharynx: Uvula midline. No oropharyngeal exudate or posterior oropharyngeal erythema.     Tonsils: No tonsillar exudate.  Eyes:     Extraocular Movements: Extraocular movements intact.     Pupils: Pupils are equal, round, and reactive to light.  Cardiovascular:     Rate and Rhythm: Regular rhythm. Tachycardia present.     Heart sounds: Normal heart sounds.  Pulmonary:     Effort: Pulmonary effort is normal.     Breath sounds: Wheezing present. No decreased breath sounds, rhonchi or rales.     Comments: Expiratory wheezes throughout. Following DuoNeb treatment:  Abdominal:     Palpations: Abdomen is soft.     Tenderness: There is no abdominal tenderness. There is no guarding or rebound.  Musculoskeletal:     Comments: No pedal edema or leg swelling.  Lymphadenopathy:     Cervical: No cervical adenopathy.     Right cervical: No superficial, deep or posterior cervical adenopathy.    Left cervical: No superficial, deep or posterior cervical adenopathy.  Skin:    Comments: No rash   Neurological:     General: No focal deficit present.     Mental Status: She is alert and oriented to person, place, and time.  Psychiatric:        Mood and Affect: Mood normal.         Behavior: Behavior normal.        Thought Content: Thought content normal.        Judgment: Judgment normal.      UC Treatments / Results  Labs (all labs ordered are listed, but only abnormal results are displayed) Labs Reviewed  POCT INFLUENZA A/B  POC SOFIA SARS ANTIGEN FIA    EKG   Radiology DG Chest 2 View Result Date: 05/08/2024 EXAM: 2 VIEW(S) XRAY OF THE CHEST 05/08/2024 12:02:56 PM COMPARISON: 04/20/2009 CLINICAL HISTORY: Wheezing throughout. Patient with emphysema. FINDINGS: LUNGS AND PLEURA: Flattening of diaphragms and mild hyperinflation. No focal pulmonary opacity. No pleural effusion. No pneumothorax. HEART AND MEDIASTINUM: Mild aortic arch calcification. No acute abnormality of the cardiac and mediastinal silhouettes. BONES AND SOFT TISSUES: Mild-to-moderate multilevel degenerative disc changes of thoracic spine. No acute osseous abnormality. IMPRESSION: 1. No acute findings. 2. Emphysema. Electronically signed by: Waddell Calk MD 05/08/2024 12:38 PM EST RP Workstation: [id number]    Procedures Procedures (including critical care time)  Medications Ordered in UC Medications  acetaminophen  (TYLENOL ) tablet 650 mg (650 mg Oral Given 05/08/24 1140)  ipratropium-albuterol  (DUONEB) 0.5-2.5 (3) MG/3ML nebulizer solution 3 mL (3 mLs Nebulization Given 05/08/24 1141)    Initial Impression / Assessment and Plan / UC Course  I have reviewed the triage vital signs and the nursing notes.  Pertinent labs & imaging results that were available during my care of the patient were reviewed by me and considered in my medical decision making (see chart for details).     Patient is a pleasant 69 y.o. female presenting with acute exacerbation of chronic emphysema due to viral illness.  She is initially afebrile, but tachycardic.  Following acetaminophen , vitals remain about the same.   -Covid negative -Influenza negative  On exam, initially with wheezes throughout.  Following  DuoNeb treatment, significant improvement  in breath sounds.   Wells score for PE: 1.5, due to tachycardia. Symptoms are more likely explained by acute viral process and emphysema exacerbation.  No recent travel, prolonged immobilization, or surgery, and she does not take HRT.  CXR: 1. No acute findings. 2. Emphysema.  Creatinine clearance estimated to be 94 mL/min based on 07/2023 CMP, using the Cockcroft-Gault equation.  Chart review indicates a history of emphysema.  She is not using her inhalers regularly.  She will start using these inhalers.  Unsure what the daily inhaler is, but she has an albuterol , which she was instructed to use every 6 hours as needed.  I also sent azithromycin  to cover for atypical pneumonia, and prednisone .  She is not a diabetic.  -Coding Level 4 for acute exacerbation of chronic condition and prescription drug management.   Final Clinical Impressions(s) / UC Diagnoses   Final diagnoses:  Viral URI with cough  Emphysema of lung (HCC)     Discharge Instructions      -Your chest x-ray looks okay to me, but if the radiologist sees something that I missed, I will call within about 90 minutes. -We are covering for both infection and inflammation with an antibiotic and a steroid. -Start the azithromycin , which you will take for 5 days. -Prednisone , 2 pills taken at the same time for 5 days in a row.  Try taking this earlier in the day as it can give you energy. Avoid NSAIDs like ibuprofen and alleve while taking this medication as they can increase your risk of stomach upset and even GI bleeding when in combination with a steroid. You can continue tylenol  (acetaminophen ) up to 1000mg  3x daily. -Start using the daily inhaler, as well as the albuterol . -Albuterol  inhaler as needed for cough, wheezing, shortness of breath, 1 to 2 puffs every 6 hours as needed. -Your COVID and influenza tests were negative. ---->You have a virus, like the common cold.  Viruses  typically last 5 to 7 days.  After 7 days, your symptoms should be improving rather than worsening.  If your symptoms improve, and then worsen again, this is when we worry about a sinus infection or a lung infection, and you should return for additional care.     ED Prescriptions     Medication Sig Dispense Auth. Provider   azithromycin  (ZITHROMAX ) 250 MG tablet Take 1 tablet (250 mg total) by mouth daily. Take first 2 tablets together, then 1 every day until finished. 6 tablet Laqueisha Catalina E, PA-C   predniSONE  (DELTASONE ) 20 MG tablet Take 2 tablets (40 mg total) by mouth daily for 5 days. Take with breakfast or lunch. Avoid NSAIDs (ibuprofen, etc) while taking this medication. 10 tablet Giovonni Poirier E, PA-C      PDMP not reviewed this encounter.     [1]  Social History Tobacco Use   Smoking status: Former    Current packs/day: 0.00    Average packs/day: 0.5 packs/day    Types: Cigarettes    Quit date: 02/20/2012    Years since quitting: 12.2   Smokeless tobacco: Never  Vaping Use   Vaping status: Never Used  Substance Use Topics   Alcohol use: Yes   Drug use: No     Arlyss Leita BRAVO, PA-C 05/08/24 1246  "

## 2024-05-18 ENCOUNTER — Ambulatory Visit: Admission: EM | Admit: 2024-05-18 | Discharge: 2024-05-18 | Disposition: A | Discharge status: Home / Self Care

## 2024-05-18 ENCOUNTER — Ambulatory Visit (INDEPENDENT_AMBULATORY_CARE_PROVIDER_SITE_OTHER)

## 2024-05-18 ENCOUNTER — Encounter: Payer: Self-pay | Admitting: Emergency Medicine

## 2024-05-18 DIAGNOSIS — R059 Cough, unspecified: Secondary | ICD-10-CM

## 2024-05-18 DIAGNOSIS — J189 Pneumonia, unspecified organism: Secondary | ICD-10-CM | POA: Diagnosis not present

## 2024-05-18 MED ORDER — LEVOFLOXACIN 750 MG PO TABS: 750.0000 mg | ORAL_TABLET | Freq: Every day | ORAL | 0 refills | Status: AC

## 2024-05-18 MED ORDER — BENZONATATE 100 MG PO CAPS: 100.0000 mg | ORAL_CAPSULE | Freq: Three times a day (TID) | ORAL | 0 refills | Status: AC

## 2024-05-18 NOTE — ED Triage Notes (Signed)
 Pt reports productive cough, nasal congestion, and body aches x 2 weeks. Pt notes she was previously sick back on 12/18 (seen at [facility]) and really only felt well for a handful of days after finishing prescriptions and many symptoms returned at that time as well. Albuterol  inhaler, mucinex,  and tylenol  at home with no relief. Hx of emphysema. Pt has pursed lip breathing and is bent over in exam chair.

## 2024-05-18 NOTE — ED Provider Notes (Signed)
 " [facility]    CSN: [id number] Arrival date & time: 05/18/24  1305      History   Chief Complaint Chief Complaint  Patient presents with   Nasal Congestion   Cough   Generalized Body Aches    HPI Kristina Moore is a 69 y.o. female.   Pt with a hx of emphysema,  returns after being seen on 1/18. Pt states that her symptoms improved but worsened on Sunday. Pt states that she is experiencing shortness of breath and persistent cough. Pt states that she has been using been mucinex, albuterol , and Tylenol  with no relief.  The history is provided by the patient.  Cough   Past Medical History:  Diagnosis Date   Autoimmune hepatitis (HCC)    Autoimmune hepatitis (HCC)    Benign neoplasm of colon    Colitis    Diverticulosis of colon (without mention of hemorrhage)    Emphysema of lung (HCC)    Inflammatory bowel disease    Internal hemorrhoids without mention of complication    Other and unspecified noninfectious gastroenteritis and colitis(558.9)    Ulcerative colitis    Ulcerative colitis     Patient Active Problem List   Diagnosis Date Noted   Vitamin D deficiency 03/13/2022   Gastroesophageal reflux disease without esophagitis 03/07/2022   Bilateral hip pain 12/02/2021   Episode of recurrent major depressive disorder 12/02/2021   GAD (generalized anxiety disorder) 12/02/2021   Other fatigue 06/07/2021   Vaginal pessary present 06/07/2021   Lichen sclerosus 05/10/2021   Vaginal atrophy 05/10/2021   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      Home Medications    Prior to Admission medications  Medication Sig Start Date End Date Taking? Authorizing Provider  albuterol  (VENTOLIN  HFA) 108 (90 Base) MCG/ACT inhaler Inhale 2 puffs into the lungs. 02/25/24 08/23/24 Yes [provider]  atorvastatin (LIPITOR) 40 MG tablet Take 1 tablet by mouth at bedtime. 02/28/23  Yes [provider]  benzonatate  (TESSALON ) 100 MG capsule Take 1 capsule (100 mg total) by mouth every 8 (eight) hours. 05/18/24  Yes Lesean Woolverton C, PA-C  clobetasol ointment (TEMOVATE) 0.05 % Apply 1 Application topically 2 (two) times daily. 09/06/21  Yes [provider]  estradiol (ESTRACE) 0.1 MG/GM vaginal cream Place 1 Applicatorful vaginally as directed. 07/29/22  Yes [provider]  fluconazole (DIFLUCAN) 150 MG tablet Take once. May repeat in 3 days if symptoms fail to fully resolve after first dose. 04/06/24  Yes [provider]  levofloxacin  (LEVAQUIN ) 750 MG tablet Take 1 tablet (750 mg total) by mouth daily for 5 days. 05/18/24 05/23/24 Yes Andra Corean BROCKS, PA-C  lisinopril-hydrochlorothiazide (ZESTORETIC) 20-25 MG tablet Take 1 tablet by mouth daily.   Yes [provider]  terconazole (TERAZOL 7) 0.4 % vaginal cream Place 1 Applicatorful vaginally. 04/06/24  Yes [provider]  diazepam  (VALIUM ) 10 MG  tablet Take 1 tablet (10 mg total) by mouth every 6 (six) hours as needed for anxiety. Patient not taking: Reported on 05/18/2024 08/15/15   Nandigam, Kavitha V, MD  ERGOCALCIFEROL PO Take 1 capsule by mouth once a week. Patient not taking: Reported on 05/18/2024 03/07/22   [provider]  glycopyrrolate (ROBINUL) 1 MG tablet Take 1 mg by mouth. Patient not taking: Reported on 05/18/2024 02/25/24 05/25/24  [provider]  hydrOXYzine (ATARAX) 50 MG tablet Take 50 mg by  mouth. Patient not taking: Reported on 05/18/2024 02/25/24 05/25/24  [provider]  ibuprofen (ADVIL) 800 MG tablet Take 800 mg by mouth every 6 (six) hours as needed.    [provider]  pantoprazole (PROTONIX) 40 MG tablet Take 40 mg by mouth daily. Patient not taking: Reported on 05/18/2024    [provider]  sertraline (ZOLOFT) 25 MG tablet Take 25 mg by mouth daily. Patient not taking: Reported on 05/18/2024    [provider]    Family History Family History  Problem Relation Age of Onset   Uterine cancer Mother    Diverticulosis Mother    Colon cancer Neg Hx    Cystic fibrosis Mother     Social History Social History[1]   Allergies   Patient has no known allergies.   Review of Systems Review of Systems  Respiratory:  Positive for cough.      Physical Exam Triage Vital Signs ED Triage Vitals  Encounter Vitals Group     BP 05/18/24 1343 127/83     Girls Systolic BP Percentile --      Girls Diastolic BP Percentile --      Boys Systolic BP Percentile --      Boys Diastolic BP Percentile --      Pulse Rate 05/18/24 1343 (!) 129     Resp 05/18/24 1343 (!) 26     Temp 05/18/24 1343 98.1 F (36.7 C)     Temp Source 05/18/24 1343 Oral     SpO2 05/18/24 1343 (!) 88 %     Weight --      Height --      Head Circumference --      Peak Flow --      Pain Score 05/18/24 1344 5     Pain Loc --      Pain Education --      Exclude from Growth Chart --    No data found.  Updated Vital Signs BP 127/83 (BP Location: Left Arm)   Pulse (!) 125   Temp 98.1 F (36.7 C) (Oral)   Resp (!) 22   SpO2 90%   Visual Acuity Right Eye Distance:   Left Eye Distance:   Bilateral Distance:    Right Eye Near:   Left Eye Near:    Bilateral Near:     Physical Exam Vitals and nursing note reviewed.  Constitutional:      General: She is not in acute distress.    Appearance: Normal appearance. She is not ill-appearing, toxic-appearing or  diaphoretic.  Eyes:     General: No scleral icterus. Cardiovascular:     Rate and Rhythm: Normal rate and regular rhythm.     Heart sounds: Normal heart sounds.  Pulmonary:     Effort: Pulmonary effort is normal. No respiratory distress.     Breath sounds: Examination of the right-lower field reveals wheezing and rhonchi. Wheezing and rhonchi present.  Skin:    General: Skin is warm.  Neurological:     Mental Status: She is alert and oriented to person, place, and time.  Psychiatric:        Mood and Affect: Mood normal.        Behavior: Behavior normal.      UC Treatments / Results  Labs (all labs ordered are listed, but only abnormal results are displayed) Labs Reviewed - No data to display  EKG   Radiology DG Chest 2 View Result Date: 05/18/2024 EXAM: 2 VIEW(S) XRAY OF THE CHEST 05/18/2024 02:20:43 PM COMPARISON: 05/08/2024 CLINICAL HISTORY: Hypoxia. FINDINGS: LUNGS AND PLEURA: Hyperinflation. New patchy opacities in right lung base. No pleural effusion. No pneumothorax. HEART AND MEDIASTINUM: Aortic arch calcifications. No acute abnormality of the cardiac and mediastinal silhouettes. BONES AND SOFT TISSUES: Multilevel degenerative changes of thoracic spine. IMPRESSION: 1. New patchy opacities in the right lung base, concerning for infection or aspiration. 2. Hyperinflation. Electronically signed by: Norleen Boxer MD 05/18/2024 02:45 PM EST RP Workstation: [id number]    Procedures Procedures (including critical care time)  Medications Ordered in UC Medications - No data to display  Initial Impression / Assessment and Plan / UC Course  I have reviewed the triage vital signs and the nursing notes.  Pertinent labs & imaging results that were available during my care of the patient were reviewed by me and considered in my medical decision making (see chart for details).     Final Clinical Impressions(s) / UC Diagnoses   Final diagnoses:  Cough, unspecified type   Pneumonia due to infectious organism, unspecified laterality, unspecified part of lung     Discharge Instructions      You have been diagnosed with pneumonia. Sent Levaquin  and tessalon  to pharmacy.      ED Prescriptions     Medication Sig Dispense Auth. Provider   levofloxacin  (LEVAQUIN ) 750 MG tablet Take 1 tablet (750 mg total) by mouth daily for 5 days. 5 tablet Andra Corean BROCKS, PA-C   benzonatate  (TESSALON ) 100 MG capsule Take 1 capsule (100 mg total) by mouth every 8 (eight) hours. 30 capsule Andra Corean BROCKS, PA-C      PDMP not reviewed this encounter.    [1]  Social History Tobacco Use   Smoking status: Former    Current packs/day: 0.00    Average packs/day: 0.5 packs/day    Types: Cigarettes    Quit date: 02/20/2012    Years since quitting: 12.2   Smokeless tobacco: Never  Vaping Use   Vaping status: Never Used  Substance Use Topics   Alcohol use: Yes   Drug use: No     Andra Corean BROCKS, PA-C 05/18/24 1452  "

## 2024-05-18 NOTE — Discharge Instructions (Addendum)
 You have been diagnosed with pneumonia. Sent Levaquin  and tessalon  to pharmacy.
